# Patient Record
Sex: Male | Born: 1935
Health system: Southern US, Community
[De-identification: ages and names within clinical notes are randomized; demographics above are authoritative.]

## PROBLEM LIST (undated history)

## (undated) DIAGNOSIS — M1990 Unspecified osteoarthritis, unspecified site: Secondary | ICD-10-CM

## (undated) DIAGNOSIS — Z87442 Personal history of urinary calculi: Secondary | ICD-10-CM

## (undated) DIAGNOSIS — E785 Hyperlipidemia, unspecified: Secondary | ICD-10-CM

## (undated) DIAGNOSIS — L309 Dermatitis, unspecified: Secondary | ICD-10-CM

## (undated) DIAGNOSIS — I4891 Unspecified atrial fibrillation: Secondary | ICD-10-CM

## (undated) DIAGNOSIS — G629 Polyneuropathy, unspecified: Secondary | ICD-10-CM

## (undated) DIAGNOSIS — E119 Type 2 diabetes mellitus without complications: Secondary | ICD-10-CM

## (undated) DIAGNOSIS — E669 Obesity, unspecified: Secondary | ICD-10-CM

## (undated) DIAGNOSIS — R911 Solitary pulmonary nodule: Secondary | ICD-10-CM

## (undated) HISTORY — DX: Polyneuropathy, unspecified: G62.9

## (undated) HISTORY — DX: Hyperlipidemia, unspecified: E78.5

## (undated) HISTORY — DX: Solitary pulmonary nodule: R91.1

## (undated) HISTORY — PX: INCISION AND DRAINAGE: SHX5863

## (undated) HISTORY — DX: Obesity, unspecified: E66.9

## (undated) HISTORY — DX: Dermatitis, unspecified: L30.9

---

## 2004-06-10 ENCOUNTER — Encounter: Admission: RE | Admit: 2004-06-10 | Discharge: 2004-06-10 | Payer: Self-pay | Admitting: Internal Medicine

## 2005-09-24 ENCOUNTER — Ambulatory Visit (HOSPITAL_COMMUNITY): Admission: RE | Admit: 2005-09-24 | Discharge: 2005-09-24 | Payer: Self-pay | Admitting: Thoracic Surgery

## 2007-08-23 ENCOUNTER — Ambulatory Visit: Payer: Self-pay | Admitting: Gastroenterology

## 2008-10-17 ENCOUNTER — Telehealth (INDEPENDENT_AMBULATORY_CARE_PROVIDER_SITE_OTHER): Payer: Self-pay | Admitting: *Deleted

## 2008-10-21 ENCOUNTER — Ambulatory Visit: Payer: Self-pay

## 2008-10-21 ENCOUNTER — Encounter: Payer: Self-pay | Admitting: Internal Medicine

## 2009-02-26 ENCOUNTER — Ambulatory Visit (HOSPITAL_COMMUNITY): Admission: RE | Admit: 2009-02-26 | Discharge: 2009-02-28 | Payer: Self-pay | Admitting: Orthopaedic Surgery

## 2010-09-25 LAB — DIFFERENTIAL
Basophils Absolute: 0.1 10*3/uL (ref 0.0–0.1)
Basophils Relative: 1 % (ref 0–1)
Eosinophils Absolute: 0.1 10*3/uL (ref 0.0–0.7)
Eosinophils Relative: 1 % (ref 0–5)
Lymphocytes Relative: 18 % (ref 12–46)
Lymphs Abs: 2.3 10*3/uL (ref 0.7–4.0)
Monocytes Absolute: 1 10*3/uL (ref 0.1–1.0)
Monocytes Relative: 8 % (ref 3–12)
Neutro Abs: 9 10*3/uL — ABNORMAL HIGH (ref 1.7–7.7)
Neutrophils Relative %: 72 % (ref 43–77)

## 2010-09-25 LAB — GLUCOSE, CAPILLARY
Glucose-Capillary: 104 mg/dL — ABNORMAL HIGH (ref 70–99)
Glucose-Capillary: 108 mg/dL — ABNORMAL HIGH (ref 70–99)
Glucose-Capillary: 109 mg/dL — ABNORMAL HIGH (ref 70–99)
Glucose-Capillary: 115 mg/dL — ABNORMAL HIGH (ref 70–99)
Glucose-Capillary: 119 mg/dL — ABNORMAL HIGH (ref 70–99)
Glucose-Capillary: 121 mg/dL — ABNORMAL HIGH (ref 70–99)
Glucose-Capillary: 127 mg/dL — ABNORMAL HIGH (ref 70–99)
Glucose-Capillary: 93 mg/dL (ref 70–99)

## 2010-09-25 LAB — BASIC METABOLIC PANEL
BUN: 18 mg/dL (ref 6–23)
BUN: 20 mg/dL (ref 6–23)
CO2: 22 mEq/L (ref 19–32)
CO2: 24 mEq/L (ref 19–32)
Calcium: 8.8 mg/dL (ref 8.4–10.5)
Calcium: 9.5 mg/dL (ref 8.4–10.5)
Chloride: 102 mEq/L (ref 96–112)
Chloride: 99 mEq/L (ref 96–112)
Creatinine, Ser: 1.5 mg/dL (ref 0.4–1.5)
Creatinine, Ser: 1.7 mg/dL — ABNORMAL HIGH (ref 0.4–1.5)
GFR calc Af Amer: 47 mL/min — ABNORMAL LOW (ref >60)
GFR calc Af Amer: 55 mL/min — ABNORMAL LOW (ref >60)
GFR calc non Af Amer: 39 mL/min — ABNORMAL LOW (ref >60)
GFR calc non Af Amer: 45 mL/min — ABNORMAL LOW (ref >60)
Glucose, Bld: 128 mg/dL — ABNORMAL HIGH (ref 70–99)
Glucose, Bld: 151 mg/dL — ABNORMAL HIGH (ref 70–99)
Potassium: 4.5 mEq/L (ref 3.5–5.1)
Potassium: 5.3 mEq/L — ABNORMAL HIGH (ref 3.5–5.1)
Sodium: 134 mEq/L — ABNORMAL LOW (ref 135–145)
Sodium: 136 mEq/L (ref 135–145)

## 2010-09-25 LAB — CULTURE, ROUTINE-ABSCESS

## 2010-09-25 LAB — ANAEROBIC CULTURE

## 2010-09-25 LAB — CULTURE, BLOOD (ROUTINE X 2)
Culture: NO GROWTH
Culture: NO GROWTH

## 2010-09-25 LAB — CBC
HCT: 36.2 % — ABNORMAL LOW (ref 39.0–52.0)
HCT: 43 % (ref 39.0–52.0)
Hemoglobin: 12.6 g/dL — ABNORMAL LOW (ref 13.0–17.0)
Hemoglobin: 14.8 g/dL (ref 13.0–17.0)
MCHC: 34.4 g/dL (ref 30.0–36.0)
MCHC: 34.8 g/dL (ref 30.0–36.0)
MCV: 96.8 fL (ref 78.0–100.0)
MCV: 97.4 fL (ref 78.0–100.0)
Platelets: 263 10*3/uL (ref 150–400)
Platelets: 332 10*3/uL (ref 150–400)
RBC: 3.71 MIL/uL — ABNORMAL LOW (ref 4.22–5.81)
RBC: 4.44 MIL/uL (ref 4.22–5.81)
RDW: 12.3 % (ref 11.5–15.5)
RDW: 13 % (ref 11.5–15.5)
WBC: 12.5 10*3/uL — ABNORMAL HIGH (ref 4.0–10.5)
WBC: 8.1 10*3/uL (ref 4.0–10.5)

## 2011-04-20 ENCOUNTER — Ambulatory Visit (INDEPENDENT_AMBULATORY_CARE_PROVIDER_SITE_OTHER): Payer: Medicare Other | Admitting: Internal Medicine

## 2011-04-20 ENCOUNTER — Encounter: Payer: Self-pay | Admitting: Internal Medicine

## 2011-04-20 VITALS — BP 140/72 | HR 76 | Ht 76.0 in | Wt 229.0 lb

## 2011-04-20 DIAGNOSIS — R1013 Epigastric pain: Secondary | ICD-10-CM

## 2011-04-20 NOTE — Patient Instructions (Signed)
Please follow up as needed 

## 2011-04-20 NOTE — Progress Notes (Signed)
HISTORY OF PRESENT ILLNESS:  Albert [name] is a 77 y.o. male , generally healthy, who sent today by Dr. Clelia Croft regarding epigastric pain. Patient was seen by Dr. Clelia Croft on 04/12/2011 for 1 month history of epigastric pain radiating into the back. Patient had been using NSAIDs and alcohol regularly. He did report some reflux symptoms and that antacids transiently help the discomfort. He underwent blood work including CBC, comprehensive metabolic panel, amylase, and lipase. These were all unremarkable. He was instructed to discontinue NSAIDs and limit alcohol. As well he was placed on daily PPI which he continues to take. He is pleased to report that several days after initiating these therapies, his pain resolved without recurrence. He states he feels well. He has had significant intentional weight loss over the past 5 years, no change in weight over the past few months. His GI review of systems is otherwise remarkable for minor constipation. He tells me that he underwent colonoscopy previously with our group. Those records have not been discovered yet.  REVIEW OF SYSTEMS:  All non-GI ROS negative except for joint aches  Past Medical History  Diagnosis Date  . Gout   . Hyperlipemia   . Pulmonary nodule     No past surgical history on file.  Social History Albert [name]  reports that he has never smoked. He has never used smokeless tobacco. He reports that he drinks alcohol. He reports that he does not use illicit drugs.  family history is negative for Colon cancer.  No Known Allergies     PHYSICAL EXAMINATION: Vital signs: BP 140/72  Pulse 76  Ht 6\' 4"  (1.93 m)  Wt 229 lb (103.874 kg)  BMI 27.87 kg/m2  Constitutional: generally well-appearing, no acute distress Psychiatric: alert and oriented x3, cooperative Eyes: extraocular movements intact, anicteric, conjunctiva pink Mouth: oral pharynx moist, no lesions Neck: supple no lymphadenopathy Cardiovascular: heart regular rate and  rhythm, no murmur Lungs: clear to auscultation bilaterally Abdomen: soft, nontender, nondistended, no obvious ascites, no peritoneal signs, normal bowel sounds, no organomegaly Extremities: no lower extremity edema bilaterally Skin: no lesions on visible extremities Neuro: No focal deficits.      ASSESSMENT:  #1. Previous problems with epigastric discomfort as described. Possibly peptic ulcer disease or gastritis related to NSAIDs. Problem improved off NSAIDs and on PPI  PLAN:  #1. Complete 8 weeks course of PPI #2. Continue to avoid NSAIDs and alcohol #3. If symptoms recur, plan for upper endoscopy. Patient understands this plan. He has been given my professional card to contact the office for problems. Otherwise, he will return to the care of Dr. Clelia Croft

## 2011-10-18 ENCOUNTER — Encounter: Payer: Self-pay | Admitting: Internal Medicine

## 2011-10-18 ENCOUNTER — Ambulatory Visit (INDEPENDENT_AMBULATORY_CARE_PROVIDER_SITE_OTHER): Payer: Medicare Other | Admitting: Internal Medicine

## 2011-10-18 VITALS — BP 112/72 | HR 63 | Ht 76.0 in | Wt 222.8 lb

## 2011-10-18 DIAGNOSIS — R195 Other fecal abnormalities: Secondary | ICD-10-CM

## 2011-10-18 NOTE — Progress Notes (Signed)
HISTORY OF PRESENT ILLNESS:  Albert [name] is a 78 y.o. male with the below listed medical history is sent today regarding Hemoccult-positive stool on routine testing. The patient was evaluated by myself, for the first time, 04/20/2011 regarding epigastric pain. See that dictation. After refraining from NSAID and alcohol use, as well as starting the PPI, his symptoms resolved. He has had no recurrence. He is currently using NSAIDs. The PPI. He underwent routine annual evaluation including Hemoccult testing which returned positive. CBC around that time, 09/09/2011, was normal with a hemoglobin of 13.5 and normal MCV. Patient reports entirely negative GI review of systems. No documentation of endoscopic evaluations previously  REVIEW OF SYSTEMS:  All non-GI ROS negative except for Joint aches  Past Medical History  Diagnosis Date  . Gout   . Hyperlipemia   . Pulmonary nodule   . Diabetes mellitus   . Obesity   . Dermatitis   . Peripheral neuropathy     Past Surgical History  Procedure Date  . Finger surgery     Social History Albert [name]  reports that he has never smoked. He has never used smokeless tobacco. He reports that he drinks alcohol. He reports that he does not use illicit drugs.  family history is negative for Colon cancer.  No Known Allergies     PHYSICAL EXAMINATION: Vital signs: BP 112/72  Pulse 63  Wt 222 lb 12.8 oz (101.061 kg)  SpO2 97% General: Well-developed, well-nourished, no acute distress HEENT: Sclerae are anicteric, conjunctiva pink. Oral mucosa intact Lungs: Clear Heart: Regular Abdomen: soft, nontender, nondistended, no obvious ascites, no peritoneal signs, normal bowel sounds. No organomegaly. Extremities: No edema Psychiatric: alert and oriented x3. Cooperative      ASSESSMENT:  #1. Asymptomatic Hemoccult-positive stool. Rule out occult GI mucosal lesion. Possibly NSAID-induced lesion. Rule out neoplasia. #2. Normal hemoglobin #3.  General medical problems   PLAN:  #1. Recommended colonoscopy and upper endoscopy. We discussed the possible causes for Hemoccult positive stool including insignificant causes of also important causes such as neoplasia (including cancer) and NSAID-induced ulcers. I discussed with him colonoscopy and upper endoscopy in detail. He understands. However, at this point, he declines having the procedure were performed. I recommend that he take a PPI prophylactically as long as he is on NSAIDs. He stated that he may contact the office to set up procedures at some point in the future. I encouraged him to do so. Otherwise, he will return to the care of Dr. Clelia Croft.

## 2011-10-18 NOTE — Patient Instructions (Signed)
Please follow up when you are ready to discuss possible procedures with Dr. Marina Goodell

## 2013-02-28 ENCOUNTER — Other Ambulatory Visit (HOSPITAL_COMMUNITY): Payer: Medicare Other

## 2013-03-12 ENCOUNTER — Ambulatory Visit (HOSPITAL_COMMUNITY): Payer: Medicare Other | Attending: Internal Medicine | Admitting: Radiology

## 2013-03-12 ENCOUNTER — Other Ambulatory Visit (HOSPITAL_COMMUNITY): Payer: Self-pay | Admitting: Internal Medicine

## 2013-03-12 DIAGNOSIS — I080 Rheumatic disorders of both mitral and aortic valves: Secondary | ICD-10-CM | POA: Insufficient documentation

## 2013-03-12 DIAGNOSIS — E785 Hyperlipidemia, unspecified: Secondary | ICD-10-CM | POA: Insufficient documentation

## 2013-03-12 DIAGNOSIS — E119 Type 2 diabetes mellitus without complications: Secondary | ICD-10-CM | POA: Insufficient documentation

## 2013-03-12 DIAGNOSIS — R011 Cardiac murmur, unspecified: Secondary | ICD-10-CM

## 2013-03-12 NOTE — Progress Notes (Signed)
Echocardiogram performed.  

## 2017-04-25 ENCOUNTER — Telehealth (HOSPITAL_COMMUNITY): Payer: Self-pay | Admitting: Internal Medicine

## 2017-04-28 NOTE — Telephone Encounter (Signed)
User: Trina AoGRIFFIN, Albert [name] Date/time: 04/28/17 10:37 AM  Comment: Called pt and was unable to leave [name] message...it asked me to enter [name] "remote access code"  Context:  Outcome: No Answer/Busy  Phone number: [phone] Phone Type: Home Phone  Comm. type: Telephone Call type: Outgoing  Contact: Oralia Manishomas, Micha S Relation to patient: Self    User: Trina AoGRIFFIN, Albert [name] Date/time: 04/27/17 9:59 AM  Comment: Called pt and was unable to leave [name] message due to the message stating "please leave your remote access code" .   Context:  Outcome: Not Available  Phone number: [phone] Phone Type: Home Phone  Comm. type: Telephone Call type: Outgoing  Contact: Oralia Manishomas, [name] Relation to patient: Self    User: Trina AoGRIFFIN, Albert [name] Date/time: 04/25/17 1:56 PM  Comment: Called pt and was unable to leave message...message said to "please leave [name] remote access number"..  Context:  Outcome: Not Available  Phone number: [phone] Phone Type: Home Phone  Comm. type: Telephone Call type: Outgoing  Contact: Oralia Manishomas, [name] Relation to patient: Self   Called [facility] and left [name] message on Misty's voicemail informing her that the patient had been contacted [name] few times to be scheduled.

## 2017-11-30 ENCOUNTER — Encounter (HOSPITAL_COMMUNITY): Payer: Self-pay | Admitting: Emergency Medicine

## 2017-11-30 ENCOUNTER — Other Ambulatory Visit: Payer: Self-pay

## 2017-11-30 ENCOUNTER — Emergency Department (HOSPITAL_COMMUNITY): Payer: Medicare HMO

## 2017-11-30 ENCOUNTER — Inpatient Hospital Stay (HOSPITAL_COMMUNITY)
Admission: EM | Admit: 2017-11-30 | Discharge: 2017-12-03 | DRG: 308 | Disposition: A | Discharge status: Home / Self Care | Payer: Medicare HMO | Attending: Internal Medicine | Admitting: Internal Medicine

## 2017-11-30 DIAGNOSIS — I272 Pulmonary hypertension, unspecified: Secondary | ICD-10-CM | POA: Diagnosis present

## 2017-11-30 DIAGNOSIS — R748 Abnormal levels of other serum enzymes: Secondary | ICD-10-CM | POA: Diagnosis not present

## 2017-11-30 DIAGNOSIS — Z974 Presence of external hearing-aid: Secondary | ICD-10-CM

## 2017-11-30 DIAGNOSIS — F0390 Unspecified dementia without behavioral disturbance: Secondary | ICD-10-CM | POA: Diagnosis present

## 2017-11-30 DIAGNOSIS — E1122 Type 2 diabetes mellitus with diabetic chronic kidney disease: Secondary | ICD-10-CM | POA: Diagnosis not present

## 2017-11-30 DIAGNOSIS — W19XXXA Unspecified fall, initial encounter: Secondary | ICD-10-CM | POA: Diagnosis not present

## 2017-11-30 DIAGNOSIS — I350 Nonrheumatic aortic (valve) stenosis: Secondary | ICD-10-CM | POA: Diagnosis present

## 2017-11-30 DIAGNOSIS — R0609 Other forms of dyspnea: Secondary | ICD-10-CM | POA: Diagnosis not present

## 2017-11-30 DIAGNOSIS — Z8673 Personal history of transient ischemic attack (TIA), and cerebral infarction without residual deficits: Secondary | ICD-10-CM

## 2017-11-30 DIAGNOSIS — I130 Hypertensive heart and chronic kidney disease with heart failure and stage 1 through stage 4 chronic kidney disease, or unspecified chronic kidney disease: Secondary | ICD-10-CM | POA: Diagnosis not present

## 2017-11-30 DIAGNOSIS — E1129 Type 2 diabetes mellitus with other diabetic kidney complication: Secondary | ICD-10-CM | POA: Diagnosis present

## 2017-11-30 DIAGNOSIS — I4891 Unspecified atrial fibrillation: Secondary | ICD-10-CM

## 2017-11-30 DIAGNOSIS — E785 Hyperlipidemia, unspecified: Secondary | ICD-10-CM | POA: Diagnosis not present

## 2017-11-30 DIAGNOSIS — F0391 Unspecified dementia with behavioral disturbance: Secondary | ICD-10-CM | POA: Diagnosis not present

## 2017-11-30 DIAGNOSIS — R778 Other specified abnormalities of plasma proteins: Secondary | ICD-10-CM | POA: Diagnosis present

## 2017-11-30 DIAGNOSIS — R4781 Slurred speech: Secondary | ICD-10-CM | POA: Diagnosis not present

## 2017-11-30 DIAGNOSIS — N183 Chronic kidney disease, stage 3 unspecified: Secondary | ICD-10-CM | POA: Diagnosis present

## 2017-11-30 DIAGNOSIS — G9341 Metabolic encephalopathy: Secondary | ICD-10-CM | POA: Diagnosis not present

## 2017-11-30 DIAGNOSIS — I5031 Acute diastolic (congestive) heart failure: Secondary | ICD-10-CM | POA: Diagnosis not present

## 2017-11-30 DIAGNOSIS — E1121 Type 2 diabetes mellitus with diabetic nephropathy: Secondary | ICD-10-CM | POA: Diagnosis not present

## 2017-11-30 DIAGNOSIS — H9190 Unspecified hearing loss, unspecified ear: Secondary | ICD-10-CM | POA: Diagnosis present

## 2017-11-30 DIAGNOSIS — W01198A Fall on same level from slipping, tripping and stumbling with subsequent striking against other object, initial encounter: Secondary | ICD-10-CM | POA: Diagnosis not present

## 2017-11-30 DIAGNOSIS — N179 Acute kidney failure, unspecified: Secondary | ICD-10-CM | POA: Diagnosis not present

## 2017-11-30 DIAGNOSIS — R079 Chest pain, unspecified: Secondary | ICD-10-CM | POA: Diagnosis present

## 2017-11-30 DIAGNOSIS — S0003XA Contusion of scalp, initial encounter: Secondary | ICD-10-CM | POA: Diagnosis not present

## 2017-11-30 DIAGNOSIS — I499 Cardiac arrhythmia, unspecified: Secondary | ICD-10-CM | POA: Diagnosis not present

## 2017-11-30 DIAGNOSIS — Z6831 Body mass index (BMI) 31.0-31.9, adult: Secondary | ICD-10-CM | POA: Diagnosis not present

## 2017-11-30 DIAGNOSIS — R4182 Altered mental status, unspecified: Secondary | ICD-10-CM | POA: Diagnosis not present

## 2017-11-30 DIAGNOSIS — R5383 Other fatigue: Secondary | ICD-10-CM | POA: Diagnosis not present

## 2017-11-30 DIAGNOSIS — R7989 Other specified abnormal findings of blood chemistry: Secondary | ICD-10-CM | POA: Diagnosis present

## 2017-11-30 HISTORY — DX: Type 2 diabetes mellitus without complications: E11.9

## 2017-11-30 HISTORY — DX: Unspecified atrial fibrillation: I48.91

## 2017-11-30 HISTORY — DX: Unspecified osteoarthritis, unspecified site: M19.90

## 2017-11-30 HISTORY — DX: Personal history of urinary calculi: Z87.442

## 2017-11-30 LAB — DIFFERENTIAL
ABS IMMATURE GRANULOCYTES: 0.1 10*3/uL (ref 0.0–0.1)
BASOS PCT: 1 %
Basophils Absolute: 0.1 10*3/uL (ref 0.0–0.1)
Eosinophils Absolute: 0.1 10*3/uL (ref 0.0–0.7)
Eosinophils Relative: 1 %
Immature Granulocytes: 1 %
LYMPHS ABS: 1.2 10*3/uL (ref 0.7–4.0)
Lymphocytes Relative: 13 %
MONO ABS: 0.9 10*3/uL (ref 0.1–1.0)
MONOS PCT: 10 %
NEUTROS ABS: 6.7 10*3/uL (ref 1.7–7.7)
Neutrophils Relative %: 74 %

## 2017-11-30 LAB — COMPREHENSIVE METABOLIC PANEL
ALK PHOS: 60 U/L (ref 38–126)
ALT: 22 U/L (ref 17–63)
AST: 26 U/L (ref 15–41)
Albumin: 3.6 g/dL (ref 3.5–5.0)
Anion gap: 9 (ref 5–15)
BILIRUBIN TOTAL: 0.9 mg/dL (ref 0.3–1.2)
BUN: 29 mg/dL — AB (ref 6–20)
CALCIUM: 9.4 mg/dL (ref 8.9–10.3)
CHLORIDE: 103 mmol/L (ref 101–111)
CO2: 26 mmol/L (ref 22–32)
Creatinine, Ser: 1.62 mg/dL — ABNORMAL HIGH (ref 0.61–1.24)
GFR calc Af Amer: 42 mL/min — ABNORMAL LOW (ref >60)
GFR, EST NON AFRICAN AMERICAN: 36 mL/min — AB (ref >60)
Glucose, Bld: 112 mg/dL — ABNORMAL HIGH (ref 65–99)
Potassium: 4.9 mmol/L (ref 3.5–5.1)
Sodium: 138 mmol/L (ref 135–145)
Total Protein: 6.7 g/dL (ref 6.5–8.1)

## 2017-11-30 LAB — PROTIME-INR
INR: 1.15
Prothrombin Time: 14.6 seconds (ref 11.4–15.2)

## 2017-11-30 LAB — CBC
HEMATOCRIT: 40.6 % (ref 39.0–52.0)
HEMOGLOBIN: 12.9 g/dL — AB (ref 13.0–17.0)
MCH: 31.5 pg (ref 26.0–34.0)
MCHC: 31.8 g/dL (ref 30.0–36.0)
MCV: 99.3 fL (ref 78.0–100.0)
Platelets: 251 10*3/uL (ref 150–400)
RBC: 4.09 MIL/uL — AB (ref 4.22–5.81)
RDW: 14.1 % (ref 11.5–15.5)
WBC: 9 10*3/uL (ref 4.0–10.5)

## 2017-11-30 LAB — I-STAT CHEM 8, ED
BUN: 31 mg/dL — AB (ref 6–20)
Calcium, Ion: 1.13 mmol/L — ABNORMAL LOW (ref 1.15–1.40)
Chloride: 101 mmol/L (ref 101–111)
Creatinine, Ser: 1.5 mg/dL — ABNORMAL HIGH (ref 0.61–1.24)
GLUCOSE: 108 mg/dL — AB (ref 65–99)
HEMATOCRIT: 40 % (ref 39.0–52.0)
Hemoglobin: 13.6 g/dL (ref 13.0–17.0)
Potassium: 4.8 mmol/L (ref 3.5–5.1)
Sodium: 137 mmol/L (ref 135–145)
TCO2: 23 mmol/L (ref 22–32)

## 2017-11-30 LAB — I-STAT TROPONIN, ED: TROPONIN I, POC: 0.15 ng/mL — AB (ref 0.00–0.08)

## 2017-11-30 LAB — APTT: aPTT: 33 seconds (ref 24–36)

## 2017-11-30 MED ORDER — ATORVASTATIN CALCIUM 80 MG PO TABS
80.0000 mg | ORAL_TABLET | Freq: Every day | ORAL | Status: DC
  Administered 2017-12-01 – 2017-12-02 (×3): 80 mg via ORAL
  Filled 2017-11-30: qty 4
  Filled 2017-11-30 (×2): qty 1

## 2017-11-30 MED ORDER — SODIUM CHLORIDE 0.9 % IV SOLN: INTRAVENOUS | Status: DC

## 2017-11-30 MED ORDER — ONDANSETRON HCL 4 MG/2ML IJ SOLN: 4.0000 mg | Freq: Four times a day (QID) | INTRAMUSCULAR | Status: DC | PRN

## 2017-11-30 MED ORDER — HEPARIN SODIUM (PORCINE) 5000 UNIT/ML IJ SOLN: 5000.0000 [IU] | Freq: Three times a day (TID) | INTRAMUSCULAR | Status: DC

## 2017-11-30 MED ORDER — ACETAMINOPHEN 325 MG PO TABS: 650.0000 mg | ORAL_TABLET | ORAL | Status: DC | PRN

## 2017-11-30 MED ORDER — ZOLPIDEM TARTRATE 5 MG PO TABS
5.0000 mg | ORAL_TABLET | Freq: Every evening | ORAL | Status: DC | PRN
  Administered 2017-12-03: 5 mg via ORAL
  Filled 2017-11-30: qty 1

## 2017-11-30 MED ORDER — MORPHINE SULFATE (PF) 4 MG/ML IV SOLN: 1.0000 mg | INTRAVENOUS | Status: DC | PRN

## 2017-11-30 MED ORDER — NITROGLYCERIN 0.4 MG SL SUBL: 0.4000 mg | SUBLINGUAL_TABLET | SUBLINGUAL | Status: DC | PRN

## 2017-11-30 MED ORDER — DEXTROSE 5 % IV SOLN
5.0000 mg/h | Freq: Once | INTRAVENOUS | Status: AC
  Administered 2017-11-30 – 2017-12-01 (×2): 7.5 mg/h via INTRAVENOUS

## 2017-11-30 MED ORDER — LORAZEPAM 2 MG/ML IJ SOLN
1.0000 mg | Freq: Once | INTRAMUSCULAR | Status: AC
Start: 2017-11-30 — End: 2017-11-30
  Administered 2017-11-30: 1 mg via INTRAVENOUS
  Filled 2017-11-30: qty 1

## 2017-11-30 MED ORDER — ASPIRIN EC 325 MG PO TBEC
325.0000 mg | DELAYED_RELEASE_TABLET | Freq: Every day | ORAL | Status: DC
  Administered 2017-12-01 – 2017-12-03 (×3): 325 mg via ORAL
  Filled 2017-11-30 (×3): qty 1

## 2017-11-30 MED ORDER — HYDRALAZINE HCL 20 MG/ML IJ SOLN: 5.0000 mg | INTRAMUSCULAR | Status: DC | PRN

## 2017-11-30 MED ORDER — DILTIAZEM HCL 100 MG IV SOLR
INTRAVENOUS | Status: AC
  Filled 2017-11-30: qty 100

## 2017-11-30 MED ORDER — SODIUM CHLORIDE 0.9 % IV BOLUS
500.0000 mL | Freq: Once | INTRAVENOUS | Status: AC
  Administered 2017-11-30: 500 mL via INTRAVENOUS

## 2017-11-30 MED ORDER — HEPARIN BOLUS VIA INFUSION
4000.0000 [IU] | Freq: Once | INTRAVENOUS | Status: AC
  Administered 2017-12-01: 4000 [IU] via INTRAVENOUS
  Filled 2017-11-30: qty 4000

## 2017-11-30 MED ORDER — HEPARIN (PORCINE) IN NACL 100-0.45 UNIT/ML-% IJ SOLN
1150.0000 [IU]/h | INTRAMUSCULAR | Status: DC
  Administered 2017-12-01 (×2): 1150 [IU]/h via INTRAVENOUS
  Filled 2017-11-30 (×2): qty 250

## 2017-11-30 MED ORDER — DEXTROSE 5 % IV SOLN
5.0000 mg/h | Freq: Once | INTRAVENOUS | Status: AC
  Administered 2017-11-30: 5 mg/h via INTRAVENOUS
  Filled 2017-11-30: qty 100

## 2017-11-30 NOTE — ED Provider Notes (Signed)
Medical screening examination/treatment/procedure(s) were conducted as a shared visit with non-physician practitioner(s) and myself.  I personally evaluated the patient during the encounter.  EKG Interpretation  Date/Time:  Wednesday November 30 2017 18:33:39 EDT Ventricular Rate:  126 PR Interval:    QRS Duration: 87 QT Interval:  322 QTC Calculation: 467 R Axis:   29 Text Interpretation:  Atrial fibrillation ST depression, probably rate related Confirmed by Lorre NickAllen, Ricky Doan ([id number]) on 11/30/2017 8:[id number] PM 84 year old male here after mechanical fall prior to arrival.  Was found to have elevated troponin as well as EKG that showed A. fib with RVR which I suspect is the culprit of his elevated troponin.  Head CT does show a small left caudate head lacunar infarct.  Will consult neurology.  Denies any chest pain.  Does have evidence of renal insufficiency.  Blood pressure is slightly soft and will be treated with IV hydration.  Heart rate is up into the 130s.  Will be given low-dose Cardizem and be admitted to the hospitalist service.   Lorre NickAllen, Ilissa Rosner, MD 11/30/17 2011

## 2017-11-30 NOTE — ED Notes (Signed)
Informed triage RN Marcie BalKaitlynn of I-stat troponin 0.15

## 2017-11-30 NOTE — ED Triage Notes (Addendum)
Patient to ED with wife, who states PCP suggested patient come here for stroke evaluation. Patient has had slurred speech, increased confusion, and loss of balance x 2-3 weeks. Patient normally ambulates with a walker, but was walking to the restroom without cane or walker, lost balance, and reached for the doorknob - fell and hit R side of head on door. No LOC. Hematoma/knot to head, pt reports mild headache. A&O x 4 at this time, PERRL. Patient has irregular heart rhythm in triage, no known hx of A-Fib. Patient denies CP, dizziness or vision changes, no extremity weakness, though he does report generalized weakness ans shortness of breath.

## 2017-11-30 NOTE — H&P (Addendum)
History and Physical    Albert [name] ZOX:[id number] DOB: Jan 03, 1928 DOA: 11/30/2017  Referring MD/NP/PA:   PCP: Martha Clan, MD   Patient coming from:  The patient is coming from home.  At baseline, pt is independent for most of ADL.   Chief Complaint: Confusion, poor balance, chest pain, palpitation, fall  HPI: Albert [name] is a 84 y.o. male with medical history significant of diet-controlled diabetes, hyperlipidemia, gout, obesity, CKD-3, currently not taking medications except for Aleve, who presents with confusion, poor balance, chest pain, palpitation and fall.  Per patient's son and wife, patient has been confused in the past 2 weeks.  Patient had broken tooth removed 2 weeks ago, since then he has been mildly confused.  She also has poor balance. He fell this AM, injured his right side of head on the door.  No LOC.  Patient does not have unilateral weakness or numbness in extremities.  No facial droop.  He seems to have mild slurred speech per his son.  He has chronic hearing loss, wearing hearing aid.  No vision loss.  Patient also has been having intermittent mild chest pain for about 1 week.  It is located in substernal area, dull, nonradiating.  It is exertional, happens when he moves around intermittently.  Currently patient does not have chest pain or shortness of breath, cough.  No fever or chills.  Patient does not have nausea, vomiting, diarrhea, abdominal pain, symptoms of UTI.  Patient was found to have new onset A. fib with RVR, with heart rate up to 130s, Cardizem drip is started in ED.  ED Course: pt was found to have positive troponin 0.15, WBC 9.0, INR 1.15, PTT 33, renal function close to baseline, temperature 97.2, tachycardia, no tachypnea, oxygen saturation 95% on room air.  Patient is admitted to stepdown as inpatient.  Cardiology will be consulted by EDP.  # CT of head showed: 1. Right parietal scalp contusion without underlying skull fracture. 2. Moderate  superficial and central atrophy with small vessel ischemia. Small left caudate head lacunar infarct. 3. Dense atherosclerotic calcifications at the skull base as above. 4. No acute intracranial abnormality identified on CT.  Review of Systems: Could not be reviewed accurately due to altered mental status.  Allergy: No Known Allergies  Past Medical History:  Diagnosis Date  . Dermatitis   . Diabetes mellitus   . Gout   . Hyperlipemia   . Obesity   . Peripheral neuropathy   . Pulmonary nodule     Past Surgical History:  Procedure Laterality Date  . FINGER SURGERY      Social History:  reports that he has never smoked. He has never used smokeless tobacco. He reports that he drinks alcohol. He reports that he does not use drugs.  Family History:  Family History  Problem Relation Age of Onset  . Colon cancer Neg Hx      Prior to Admission medications   Not on File    Physical Exam: Vitals:   12/01/17 0145 12/01/17 0200 12/01/17 0215 12/01/17 0230  BP:  110/78  103/83  Pulse:  96    Resp: 20 18 (!) 32   Temp:      TempSrc:      SpO2:  95% 95%   Weight:      Height:       General: Not in acute distress HEENT: has a hematoma in the right parietal area       Eyes: PERRL, EOMI,  no scleral icterus.       ENT: No discharge from the ears and nose, no pharynx injection, no tonsillar enlargement.        Neck: No JVD, no bruit, no mass felt. Heme: No neck lymph node enlargement. Cardiac: S1/S2, RRR, No murmurs, No gallops or rubs. Respiratory: No rales, wheezing, rhonchi or rubs. GI: Soft, nondistended, nontender, no rebound pain, no organomegaly, BS present. GU: No hematuria Ext: No pitting leg edema bilaterally. 2+DP/PT pulse bilaterally. Musculoskeletal: No joint deformities, No joint redness or warmth, no limitation of ROM in spin. Skin: No rashes.  Neuro: mildly confused, oriented to place and person, but not to time, cranial nerves II-XII grossly intact, moves all  extremities normally. Psych: Patient is not psychotic, no suicidal or hemocidal ideation.  Labs on Admission: I have personally reviewed following labs and imaging studies  CBC: Recent Labs  Lab 11/30/17 1745 11/30/17 1751  WBC 9.0  --   NEUTROABS 6.7  --   HGB 12.9* 13.6  HCT 40.6 40.0  MCV 99.3  --   PLT 251  --    Basic Metabolic Panel: Recent Labs  Lab 11/30/17 1745 11/30/17 1751  NA 138 137  K 4.9 4.8  CL 103 101  CO2 26  --   GLUCOSE 112* 108*  BUN 29* 31*  CREATININE 1.62* 1.50*  CALCIUM 9.4  --    GFR: Estimated Creatinine Clearance: 41.2 mL/min (A) (by C-G formula based on SCr of 1.5 mg/dL (H)). Liver Function Tests: Recent Labs  Lab 11/30/17 1745  AST 26  ALT 22  ALKPHOS 60  BILITOT 0.9  PROT 6.7  ALBUMIN 3.6   No results for input(s): LIPASE, AMYLASE in the last 168 hours. Recent Labs  Lab 11/30/17 2335  AMMONIA 34   Coagulation Profile: Recent Labs  Lab 11/30/17 1745  INR 1.15   Cardiac Enzymes: Recent Labs  Lab 11/30/17 2335  TROPONINI 0.14*   BNP (last 3 results) No results for input(s): PROBNP in the last 8760 hours. HbA1C: No results for input(s): HGBA1C in the last 72 hours. CBG: No results for input(s): GLUCAP in the last 168 hours. Lipid Profile: No results for input(s): CHOL, HDL, LDLCALC, TRIG, CHOLHDL, LDLDIRECT in the last 72 hours. Thyroid Function Tests: No results for input(s): TSH, T4TOTAL, FREET4, T3FREE, THYROIDAB in the last 72 hours. Anemia Panel: No results for input(s): VITAMINB12, FOLATE, FERRITIN, TIBC, IRON, RETICCTPCT in the last 72 hours. Urine analysis: No results found for: COLORURINE, APPEARANCEUR, LABSPEC, PHURINE, GLUCOSEU, HGBUR, BILIRUBINUR, KETONESUR, PROTEINUR, UROBILINOGEN, NITRITE, LEUKOCYTESUR Sepsis Labs: @LABRCNTIP (procalcitonin:4,lacticidven:4) )No results found for this or any previous visit (from the past 240 hour(s)).   Radiological Exams on Admission: Ct Head Wo Contrast  Result  Date: 11/30/2017 CLINICAL DATA:  Slurred speech and increased confusion with loss of balance for 2-3 weeks. EXAM: CT HEAD WITHOUT CONTRAST TECHNIQUE: Contiguous axial images were obtained from the base of the skull through the vertex without intravenous contrast. COMPARISON:  None. FINDINGS: BRAIN: There is moderate sulcal and ventricular prominence consistent with superficial and central atrophy. Left caudate head lacunar infarct is noted, age indeterminate but likely chronic. No intraparenchymal hemorrhage, mass effect nor midline shift. Mild-to-moderate periventricular and subcortical white matter hypodensities consistent with chronic small vessel ischemic disease are identified. No acute large vascular territory infarcts. No abnormal extra-axial fluid collections. Basal cisterns are not effaced and midline. VASCULAR: Moderate calcific atherosclerosis of the carotid siphons and both middle cerebral arteries. SKULL: No skull fracture. No significant  scalp soft tissue swelling. SINUSES/ORBITS: The mastoid air-cells are clear. The included paranasal sinuses are well-aerated.The included ocular globes and orbital contents are non-suspicious. OTHER: Right posterior parietal scalp contusion is noted. IMPRESSION: 1. Right parietal scalp contusion without underlying skull fracture. 2. Moderate superficial and central atrophy with small vessel ischemia. Small left caudate head lacunar infarct. 3. Dense atherosclerotic calcifications at the skull base as above. 4. No acute intracranial abnormality identified on CT. Electronically Signed   By: Tollie Eth M.D.   On: 11/30/2017 18:26   Mr Brain Wo Contrast (neuro Protocol)  Result Date: 11/30/2017 CLINICAL DATA:  Slurred speech and altered mental status EXAM: MRI HEAD WITHOUT CONTRAST TECHNIQUE: Multiplanar, multiecho pulse sequences of the brain and surrounding structures were obtained without intravenous contrast. COMPARISON:  Head CT 11/30/2017 FINDINGS: The study is  degraded by motion, despite efforts to reduce this artifact, including utilization of motion-resistant MR sequences. The findings of the study are interpreted in the context of reduced sensitivity/specificity. BRAIN: There is no acute infarct, acute hemorrhage or mass effect. The midline structures are normal. There is an old right frontal lobe infarct. Minimal white matter hyperintensity, nonspecific and commonly seen in asymptomatic patients of this age. Generalized atrophy without lobar predilection. Susceptibility-sensitive sequences show no chronic microhemorrhage or superficial siderosis. VASCULAR: Major intracranial arterial and venous sinus flow voids are preserved. SKULL AND UPPER CERVICAL SPINE: The visualized skull base, calvarium, upper cervical spine and extracranial soft tissues are normal. SINUSES/ORBITS: No fluid levels or advanced mucosal thickening. No mastoid or middle ear effusion. The orbits are normal. IMPRESSION: Old right frontal lobe infarct and generalized brain atrophy without acute abnormality. Electronically Signed   By: Deatra Robinson M.D.   On: 11/30/2017 22:48     EKG: Independently reviewed.  Atrial fibrillation, QTc 409, ST depression in inferior leads and V3-V6, poor R wave progression  Assessment/Plan Principal Problem:   Acute metabolic encephalopathy Active Problems:   Atrial fibrillation with RVR (HCC)   Elevated troponin   Fall   Type II diabetes mellitus with renal manifestations (HCC)   HLD (hyperlipidemia)   CKD (chronic kidney disease), stage III (HCC)   Chest pain   Acute metabolic encephalopathy and fall: Etiology is not clear. Patient may have mild slurred speech and poor balance, but no other focal neurological findings. CT head is negative for acute intracranial abnormalities, but showed a small lacunar infarcts. MRI of brain is negative for acute issues, but showed old right frontal lobe infarct. DD include delirium, early stage of dementia. Will  need to r/u UA and vitamin B12 deficiency.   -will admit to SDU as inpt due to A fib with RVR -Frequent neuro check -Check B 12, TSH, RPR, ammonia level -Check orthostatic vital sign -PT/OT -IV fluids: Normal saline 500 mL  New onset of atrial fibrillation with RVR (HCC): HR is upto 130s. card was consulted by EDP -on Cardizem gtt -will start oral cardizem 30 mg tid -started IV heparin  -check TSH, BNP  Chest pain and elevated trop: trop 0.15, indicating possible NSTEMI - on IV heparin.  - cycle CE q6 x3 and repeat EKG in the am  - prn Nitroglycerin, Morphine, and aspirin, lipitor  - Risk factor stratification: will check FLP and A1C  - 2d echo  Type II diabetes mellitus with renal manifestations (HCC): no a1c on record. Not taking meds at home. Blood sugar is 108. -f/u A1c -check CBG qAM.  HLD: -started lipitor  CKD (chronic kidney disease), stage  III [facility]): Close to baseline. Baseline creatinine 1.5-1.7. His creatinine is 1.60, BUN 31. -Follow-up renal function by BMP  HLD: -on lipitor    DVT ppx: on IV Heparin Code Status: Partial code (I discussed with the patient wife and son, his son is POA, and explained the meaning of CODE STATUS, patient wants to be partial code, OK for CPR, but no intubation). Family Communication:   Yes, patient's wife and son  at bed side Disposition Plan:  Anticipate discharge back to previous home environment Consults called: Card Admission status:   SDU/inpation       Date of Service 12/01/2017    Lorretta Harp [facility] Pager [phone]  If 7PM-7AM, please contact night-coverage [email or web address] Password [email or web address] 12/01/2017, 3:22 AM

## 2017-11-30 NOTE — ED Provider Notes (Signed)
[facility] EMERGENCY DEPARTMENT Provider Note   CSN: [id number] Arrival date & time: 11/30/17  1724     History   Chief Complaint Chief Complaint  Patient presents with  . Stroke Symptoms    HPI Albert [name] is a 84 y.o. male.  HPI Patient presents to the emergency department with 2-week history of increasing fatigue with mild exertion and elevated heart rates.  Along with altered mental status.  The patient had a fall earlier this morning striking his head against the door frame.  The patient is unable to give me a totally accurate history.  The family mostly is a most history for him.  The patient denies  shortness of breath, headache,blurred vision, neck pain, fever, cough, numbness, dizziness, anorexia, edema, abdominal pain, nausea, vomiting, diarrhea, rash, back pain, dysuria, hematemesis, bloody stool, near syncope, or syncope. Past Medical History:  Diagnosis Date  . Dermatitis   . Diabetes mellitus   . Gout   . Hyperlipemia   . Obesity   . Peripheral neuropathy   . Pulmonary nodule     Patient Active Problem List   Diagnosis Date Noted  . Acute metabolic encephalopathy 11/30/2017  . Atrial fibrillation with RVR (HCC) 11/30/2017  . Elevated troponin 11/30/2017  . Fall 11/30/2017  . Type II diabetes mellitus with renal manifestations (HCC) 11/30/2017  . HLD (hyperlipidemia) 11/30/2017  . CKD (chronic kidney disease), stage III (HCC) 11/30/2017  . Chest pain 11/30/2017    Past Surgical History:  Procedure Laterality Date  . FINGER SURGERY          Home Medications    Prior to Admission medications   Not on File    Family History Family History  Problem Relation Age of Onset  . Colon cancer Neg Hx     Social History Social History   Tobacco Use  . Smoking status: Never Smoker  . Smokeless tobacco: Never Used  Substance Use Topics  . Alcohol use: Yes  . Drug use: No     Allergies   Patient has no known  allergies.   Review of Systems Review of Systems All other systems negative except as documented in the HPI. All pertinent positives and negatives as reviewed in the HPI.  Physical Exam Updated Vital Signs BP 110/83   Pulse (!) 119   Temp (!) 97.2 F (36.2 C) (Oral)   Resp (!) 21   Ht 5\' 11"  (1.803 m)   Wt 100.8 kg (222 lb 5 oz)   SpO2 92%   BMI 31.01 kg/m   Physical Exam  Constitutional: He appears well-developed and well-nourished. No distress.  HENT:  Head: Normocephalic and atraumatic.  Mouth/Throat: Oropharynx is clear and moist.  Eyes: Pupils are equal, round, and reactive to light.  Neck: Normal range of motion. Neck supple.  Cardiovascular: Normal rate, regular rhythm and normal heart sounds. Exam reveals no gallop and no friction rub.  No murmur heard. Pulmonary/Chest: Effort normal and breath sounds normal. No respiratory distress. He has no wheezes.  Abdominal: Soft. Bowel sounds are normal. He exhibits no distension. There is no tenderness.  Neurological: He is alert. He exhibits normal muscle tone. Coordination normal.  Patient seems somewhat confused at times but then other times seems to be responding appropriately.  Skin: Skin is warm and dry. Capillary refill takes less than 2 seconds. No rash noted. No erythema.  Psychiatric: He has a normal mood and affect. His behavior is normal.  Nursing note and vitals  reviewed.    ED Treatments / Results  Labs (all labs ordered are listed, but only abnormal results are displayed) Labs Reviewed  CBC - Abnormal; Notable for the following components:      Result Value   RBC 4.09 (*)    Hemoglobin 12.9 (*)    All other components within normal limits  COMPREHENSIVE METABOLIC PANEL - Abnormal; Notable for the following components:   Glucose, Bld 112 (*)    BUN 29 (*)    Creatinine, Ser 1.62 (*)    GFR calc non Af Amer 36 (*)    GFR calc Af Amer 42 (*)    All other components within normal limits  I-STAT  TROPONIN, ED - Abnormal; Notable for the following components:   Troponin i, poc 0.15 (*)    All other components within normal limits  I-STAT CHEM 8, ED - Abnormal; Notable for the following components:   BUN 31 (*)    Creatinine, Ser 1.50 (*)    Glucose, Bld 108 (*)    Calcium, Ion 1.13 (*)    All other components within normal limits  URINE CULTURE  PROTIME-INR  APTT  DIFFERENTIAL  BRAIN NATRIURETIC PEPTIDE  URINALYSIS, ROUTINE W REFLEX MICROSCOPIC  HEMOGLOBIN A1C  LIPID PANEL  TROPONIN I  TROPONIN I  TROPONIN I  TSH  AMMONIA    EKG EKG Interpretation  Date/Time:  Wednesday November 30 2017 18:33:39 EDT Ventricular Rate:  126 PR Interval:    QRS Duration: 87 QT Interval:  322 QTC Calculation: 467 R Axis:   29 Text Interpretation:  Atrial fibrillation ST depression, probably rate related Confirmed by Lorre NickAllen, Anthony ([id number]) on 11/30/2017 8:05:31 PM   Radiology Ct Head Wo Contrast  Result Date: 11/30/2017 CLINICAL DATA:  Slurred speech and increased confusion with loss of balance for 2-3 weeks. EXAM: CT HEAD WITHOUT CONTRAST TECHNIQUE: Contiguous axial images were obtained from the base of the skull through the vertex without intravenous contrast. COMPARISON:  None. FINDINGS: BRAIN: There is moderate sulcal and ventricular prominence consistent with superficial and central atrophy. Left caudate head lacunar infarct is noted, age indeterminate but likely chronic. No intraparenchymal hemorrhage, mass effect nor midline shift. Mild-to-moderate periventricular and subcortical white matter hypodensities consistent with chronic small vessel ischemic disease are identified. No acute large vascular territory infarcts. No abnormal extra-axial fluid collections. Basal cisterns are not effaced and midline. VASCULAR: Moderate calcific atherosclerosis of the carotid siphons and both middle cerebral arteries. SKULL: No skull fracture. No significant scalp soft tissue swelling. SINUSES/ORBITS:  The mastoid air-cells are clear. The included paranasal sinuses are well-aerated.The included ocular globes and orbital contents are non-suspicious. OTHER: Right posterior parietal scalp contusion is noted. IMPRESSION: 1. Right parietal scalp contusion without underlying skull fracture. 2. Moderate superficial and central atrophy with small vessel ischemia. Small left caudate head lacunar infarct. 3. Dense atherosclerotic calcifications at the skull base as above. 4. No acute intracranial abnormality identified on CT. Electronically Signed   By: Tollie Ethavid  Kwon M.D.   On: 11/30/2017 18:26   Mr Brain Wo Contrast (neuro Protocol)  Result Date: 11/30/2017 CLINICAL DATA:  Slurred speech and altered mental status EXAM: MRI HEAD WITHOUT CONTRAST TECHNIQUE: Multiplanar, multiecho pulse sequences of the brain and surrounding structures were obtained without intravenous contrast. COMPARISON:  Head CT 11/30/2017 FINDINGS: The study is degraded by motion, despite efforts to reduce this artifact, including utilization of motion-resistant MR sequences. The findings of the study are interpreted in the context of reduced sensitivity/specificity. BRAIN: There  is no acute infarct, acute hemorrhage or mass effect. The midline structures are normal. There is an old right frontal lobe infarct. Minimal white matter hyperintensity, nonspecific and commonly seen in asymptomatic patients of this age. Generalized atrophy without lobar predilection. Susceptibility-sensitive sequences show no chronic microhemorrhage or superficial siderosis. VASCULAR: Major intracranial arterial and venous sinus flow voids are preserved. SKULL AND UPPER CERVICAL SPINE: The visualized skull base, calvarium, upper cervical spine and extracranial soft tissues are normal. SINUSES/ORBITS: No fluid levels or advanced mucosal thickening. No mastoid or middle ear effusion. The orbits are normal. IMPRESSION: Old right frontal lobe infarct and generalized brain atrophy  without acute abnormality. Electronically Signed   By: Deatra Robinson M.D.   On: 11/30/2017 22:48    Procedures Procedures (including critical care time)  Medications Ordered in ED Medications  sodium chloride 0.9 % bolus 500 mL (has no administration in time range)  0.9 %  sodium chloride infusion (has no administration in time range)  aspirin EC tablet 325 mg (has no administration in time range)  atorvastatin (LIPITOR) tablet 80 mg (has no administration in time range)  nitroGLYCERIN (NITROSTAT) SL tablet 0.4 mg (has no administration in time range)  morphine 4 MG/ML injection 1 mg (has no administration in time range)  acetaminophen (TYLENOL) tablet 650 mg (has no administration in time range)  ondansetron (ZOFRAN) injection 4 mg (has no administration in time range)  zolpidem (AMBIEN) tablet 5 mg (has no administration in time range)  hydrALAZINE (APRESOLINE) injection 5 mg (has no administration in time range)  diltiazem (CARDIZEM) 100 mg in dextrose 5 % 100 mL (1 mg/mL) infusion (0 mg/hr Intravenous Stopped 11/30/17 2257)  LORazepam (ATIVAN) injection 1 mg (1 mg Intravenous Given 11/30/17 2222)  diltiazem (CARDIZEM) 100 mg in dextrose 5 % 100 mL (1 mg/mL) infusion (7.5 mg/hr Intravenous New Bag/Given 11/30/17 2256)     Initial Impression / Assessment and Plan / ED Course  I have reviewed the triage vital signs and the nursing notes.  Pertinent labs & imaging results that were available during my care of the patient were reviewed by me and considered in my medical decision making (see chart for details).     I spoke with cardiology along with the [facility] who will admit the patient for further evaluation and care.  Patient's been stable here in the emergency department did start Cardizem due to the fact that he is in atrial fibrillation.  Patient did have a slight bump in his troponin and felt this was due to demand ischemia.  CRITICAL CARE Performed by: Jamesetta Orleans  Pantelis Elgersma Total critical care time: 30 minutes Critical care time was exclusive of separately billable procedures and treating other patients. Critical care was necessary to treat or prevent imminent or life-threatening deterioration. Critical care was time spent personally by me on the following activities: development of treatment plan with patient and/or surrogate as well as nursing, discussions with consultants, evaluation of patient's response to treatment, examination of patient, obtaining history from patient or surrogate, ordering and performing treatments and interventions, ordering and review of laboratory studies, ordering and review of radiographic studies, pulse oximetry and re-evaluation of patient's condition.   Final Clinical Impressions(s) / ED Diagnoses   Final diagnoses:  None    ED Discharge Orders    None       Kyra Manges 11/30/17 2344    Lorre Nick, MD 12/01/17 [phone]

## 2017-12-01 ENCOUNTER — Encounter (HOSPITAL_COMMUNITY): Payer: Self-pay | Admitting: General Practice

## 2017-12-01 ENCOUNTER — Inpatient Hospital Stay (HOSPITAL_COMMUNITY): Payer: Medicare HMO

## 2017-12-01 DIAGNOSIS — E1121 Type 2 diabetes mellitus with diabetic nephropathy: Secondary | ICD-10-CM

## 2017-12-01 DIAGNOSIS — I4891 Unspecified atrial fibrillation: Principal | ICD-10-CM

## 2017-12-01 DIAGNOSIS — R748 Abnormal levels of other serum enzymes: Secondary | ICD-10-CM

## 2017-12-01 DIAGNOSIS — R4182 Altered mental status, unspecified: Secondary | ICD-10-CM

## 2017-12-01 DIAGNOSIS — R079 Chest pain, unspecified: Secondary | ICD-10-CM

## 2017-12-01 DIAGNOSIS — N183 Chronic kidney disease, stage 3 (moderate): Secondary | ICD-10-CM

## 2017-12-01 DIAGNOSIS — E785 Hyperlipidemia, unspecified: Secondary | ICD-10-CM

## 2017-12-01 DIAGNOSIS — G9341 Metabolic encephalopathy: Secondary | ICD-10-CM

## 2017-12-01 LAB — MAGNESIUM: MAGNESIUM: 2 mg/dL (ref 1.7–2.4)

## 2017-12-01 LAB — CBC WITH DIFFERENTIAL/PLATELET
ABS IMMATURE GRANULOCYTES: 0.1 10*3/uL (ref 0.0–0.1)
BASOS ABS: 0.1 10*3/uL (ref 0.0–0.1)
BASOS PCT: 1 %
Eosinophils Absolute: 0.2 10*3/uL (ref 0.0–0.7)
Eosinophils Relative: 2 %
HCT: 39.1 % (ref 39.0–52.0)
HEMOGLOBIN: 12.2 g/dL — AB (ref 13.0–17.0)
IMMATURE GRANULOCYTES: 1 %
LYMPHS PCT: 14 %
Lymphs Abs: 1.1 10*3/uL (ref 0.7–4.0)
MCH: 31.6 pg (ref 26.0–34.0)
MCHC: 31.2 g/dL (ref 30.0–36.0)
MCV: 101.3 fL — ABNORMAL HIGH (ref 78.0–100.0)
Monocytes Absolute: 0.9 10*3/uL (ref 0.1–1.0)
Monocytes Relative: 12 %
NEUTROS ABS: 5.2 10*3/uL (ref 1.7–7.7)
NEUTROS PCT: 70 %
Platelets: 216 10*3/uL (ref 150–400)
RBC: 3.86 MIL/uL — AB (ref 4.22–5.81)
RDW: 14.3 % (ref 11.5–15.5)
WBC: 7.5 10*3/uL (ref 4.0–10.5)

## 2017-12-01 LAB — LIPID PANEL
CHOLESTEROL: 127 mg/dL (ref 0–200)
HDL: 46 mg/dL (ref >40)
LDL Cholesterol: 75 mg/dL (ref 0–99)
TRIGLYCERIDES: 32 mg/dL (ref <150)
Total CHOL/HDL Ratio: 2.8 RATIO
VLDL: 6 mg/dL (ref 0–40)

## 2017-12-01 LAB — BASIC METABOLIC PANEL
ANION GAP: 9 (ref 5–15)
BUN: 25 mg/dL — ABNORMAL HIGH (ref 6–20)
CALCIUM: 8.8 mg/dL — AB (ref 8.9–10.3)
CO2: 24 mmol/L (ref 22–32)
Chloride: 106 mmol/L (ref 101–111)
Creatinine, Ser: 1.4 mg/dL — ABNORMAL HIGH (ref 0.61–1.24)
GFR, EST AFRICAN AMERICAN: 50 mL/min — AB (ref >60)
GFR, EST NON AFRICAN AMERICAN: 43 mL/min — AB (ref >60)
GLUCOSE: 85 mg/dL (ref 65–99)
POTASSIUM: 4.7 mmol/L (ref 3.5–5.1)
Sodium: 139 mmol/L (ref 135–145)

## 2017-12-01 LAB — TROPONIN I
Troponin I: 0.11 ng/mL (ref <0.03)
Troponin I: 0.13 ng/mL (ref <0.03)
Troponin I: 0.14 ng/mL (ref <0.03)

## 2017-12-01 LAB — URINALYSIS, ROUTINE W REFLEX MICROSCOPIC
Bacteria, UA: NONE SEEN
Bilirubin Urine: NEGATIVE
GLUCOSE, UA: NEGATIVE mg/dL
HGB URINE DIPSTICK: NEGATIVE
Ketones, ur: NEGATIVE mg/dL
Leukocytes, UA: NEGATIVE
NITRITE: NEGATIVE
PH: 5 (ref 5.0–8.0)
PROTEIN: 30 mg/dL — AB
SPECIFIC GRAVITY, URINE: 1.024 (ref 1.005–1.030)

## 2017-12-01 LAB — VITAMIN B12: Vitamin B-12: 595 pg/mL (ref 180–914)

## 2017-12-01 LAB — CBG MONITORING, ED
GLUCOSE-CAPILLARY: 77 mg/dL (ref 65–99)
GLUCOSE-CAPILLARY: 79 mg/dL (ref 65–99)

## 2017-12-01 LAB — HEPARIN LEVEL (UNFRACTIONATED)
HEPARIN UNFRACTIONATED: 0.61 [IU]/mL (ref 0.30–0.70)
Heparin Unfractionated: 0.47 IU/mL (ref 0.30–0.70)

## 2017-12-01 LAB — RPR: RPR: NONREACTIVE

## 2017-12-01 LAB — ECHOCARDIOGRAM COMPLETE
Height: 71 in
Weight: 3557 oz

## 2017-12-01 LAB — HEMOGLOBIN A1C
HEMOGLOBIN A1C: 5.2 % (ref 4.8–5.6)
MEAN PLASMA GLUCOSE: 102.54 mg/dL

## 2017-12-01 LAB — AMMONIA: AMMONIA: 34 umol/L (ref 9–35)

## 2017-12-01 LAB — BRAIN NATRIURETIC PEPTIDE: B Natriuretic Peptide: 632.6 pg/mL — ABNORMAL HIGH (ref 0.0–100.0)

## 2017-12-01 LAB — TSH: TSH: 3.931 u[IU]/mL (ref 0.350–4.500)

## 2017-12-01 LAB — GLUCOSE, CAPILLARY: GLUCOSE-CAPILLARY: 138 mg/dL — AB (ref 65–99)

## 2017-12-01 LAB — MRSA PCR SCREENING: MRSA by PCR: NEGATIVE

## 2017-12-01 MED ORDER — DILTIAZEM HCL 60 MG PO TABS
60.0000 mg | ORAL_TABLET | Freq: Four times a day (QID) | ORAL | Status: DC
  Administered 2017-12-01 – 2017-12-02 (×3): 60 mg via ORAL
  Filled 2017-12-01 (×4): qty 1

## 2017-12-01 MED ORDER — DILTIAZEM HCL 60 MG PO TABS
30.0000 mg | ORAL_TABLET | Freq: Three times a day (TID) | ORAL | Status: DC
  Administered 2017-12-01 (×2): 30 mg via ORAL
  Filled 2017-12-01 (×3): qty 1

## 2017-12-01 NOTE — Progress Notes (Signed)
ANTICOAGULATION CONSULT NOTE - Follow-Up Consult  Pharmacy Consult for heparin Indication: atrial fibrillation  No Known Allergies  Patient Measurements: Height: 5\' 11"  (180.3 cm) Weight: 222 lb 5 oz (100.8 kg) IBW/kg (Calculated) : 75.3 Heparin Dosing Weight: 96.1 kg  Vital Signs: BP: 101/78 (06/13 1030) Pulse Rate: 106 (06/13 1030)  Labs: Recent Labs    11/30/17 1745 11/30/17 1751 11/30/17 2335 12/01/17 0356 12/01/17 1020  HGB 12.9* 13.6  --   --  12.2*  HCT 40.6 40.0  --   --  39.1  PLT 251  --   --   --  216  APTT 33  --   --   --   --   LABPROT 14.6  --   --   --   --   INR 1.15  --   --   --   --   HEPARINUNFRC  --   --   --   --  0.47  CREATININE 1.62* 1.50*  --   --  1.40*  TROPONINI  --   --  0.14* 0.13* 0.11*    Estimated Creatinine Clearance: 44.1 mL/min (A) (by C-G formula based on SCr of 1.4 mg/dL (H)).   Medical History: Past Medical History:  Diagnosis Date  . Dermatitis   . Diabetes mellitus   . Gout   . Hyperlipemia   . Obesity   . Peripheral neuropathy   . Pulmonary nodule     Medications:  See medication history, was not on anticoagulation prior to admission  Assessment: [age] yoM with new onset AFib with RVR. CHADSVASc = 3, CT head negative, CBC stable. Initial heparin level therapeutic at 0.47  Goal of Therapy:  Heparin level 0.3-0.7 units/ml Monitor platelets by anticoagulation protocol: Yes   Plan:  -Heparin 1150 units/hr -Check 8-hr confirmatory heparin level -F/U longterm OAC plan  Fredonia HighlandMichael Bitonti, PharmD, BCPS PGY-2 Cardiology Pharmacy Resident Phone: [phone] 12/01/2017

## 2017-12-01 NOTE — Progress Notes (Signed)
Physical Therapy Evaluation Patient Details Name: Albert [name] MRN: [id number] DOB: 1927-07-15 Today's Date: 12/01/2017   History of Present Illness  Patient is a 84 y/o male presenting with confusion, fall, poor balance, and chest pain. Admitted with acute metabolic encephalopathy with unclear etiology. Of note, patient with new onset A Fib with RVR. CT head is negative for acute intracranial abnormalities, but showed a small lacunar infarcts. Patient with a PMH significant for diet-controlled diabetes, hyperlipidemia, gout, obesity, CKD-3, currently not taking medications except for Aleve.Patient is a 84 y/o male presenting with confusion, fall, poor balance, and chest pain. Admitted with acute metabolic encephalopathy with unclear etiology. Of note, patient with new onset A Fib with RVR. CT head is negative for acute intracranial abnormalities, but showed a small lacunar infarcts. Patient with a PMH significant for diet-controlled diabetes, hyperlipidemia, gout, obesity, CKD-3, currently not taking medications except for Aleve.  Clinical Impression   [name] is a very pleasant 84 y/o male admitted with the above listed diagnosis. Patients son and wife present for session. Reports that prior to admission patient was able to ambulate within the home but required assist for ADLs from wife. Today patient requiring Mod A +2 for sit to stand and Min A + 2 for stand pivot transfer to recliner. Patient also with confusion during exam with poor safety awareness and reduced follow through with one-step commands. At this time, PT recommending short term SNF stay due to reduced functional mobility (see below), however patients wife interested in returning home. PT to follow acutely to maximize mobility and safety as able.     Follow Up Recommendations SNF;Supervision/Assistance - 24 hour    Equipment Recommendations  None recommended by PT    Recommendations for Other Services       Precautions /  Restrictions Precautions Precautions: Fall Restrictions Weight Bearing Restrictions: No      Mobility  Bed Mobility Overal bed mobility: Needs Assistance Bed Mobility: Supine to Sit     Supine to sit: Min assist     General bed mobility comments: for LE management; heavy verbal cueing for sequencing and safety as well as motivation to complete task  Transfers Overall transfer level: Needs assistance Equipment used: Rolling walker (2 wheeled) Transfers: Sit to/from [employer] Sit to Stand: Mod assist;+2 physical assistance Stand pivot transfers: Min assist;+2 physical assistance       General transfer comment: Mod A +2 to power up from elevated bed - heavy forward trunk flexion with standing with poor safety awareness with assistive device; Min A to manage RW  Ambulation/Gait                Stairs            Wheelchair Mobility    Modified Rankin (Stroke Patients Only)       Balance Overall balance assessment: Needs assistance Sitting-balance support: No upper extremity supported;Feet supported Sitting balance-Leahy Scale: Fair     Standing balance support: Bilateral upper extremity supported;During functional activity Standing balance-Leahy Scale: Poor                               Pertinent Vitals/Pain Pain Assessment: No/denies pain    Home Living Family/patient expects to be discharged to:: Private residence Living Arrangements: Spouse/significant other Available Help at Discharge: Family;Friend(s) Type of Home: House         Home Equipment: Dan Humphreys - 2 wheels Additional Comments: wife  reports patient was only ambulatory within the home    Prior Function Level of Independence: Needs assistance   Gait / Transfers Assistance Needed: RW - only within the home  ADL's / Homemaking Assistance Needed: wife assisting        Hand Dominance        Extremity/Trunk Assessment        Lower Extremity  Assessment Lower Extremity Assessment: Generalized weakness       Communication   Communication: HOH  Cognition Arousal/Alertness: Awake/alert Behavior During Therapy: WFL for tasks assessed/performed Overall Cognitive Status: Impaired/Different from baseline Area of Impairment: Orientation;Following commands;Safety/judgement;Problem solving                 Orientation Level: Disoriented to;Time;Situation     Following Commands: Follows one step commands with increased time Safety/Judgement: Decreased awareness of safety;Decreased awareness of deficits   Problem Solving: Slow processing;Decreased initiation;Difficulty sequencing;Requires verbal cues;Requires tactile cues        General Comments      Exercises     Assessment/Plan    PT Assessment Patient needs continued PT services  PT Problem List Decreased strength;Decreased activity tolerance;Decreased balance;Decreased mobility;Decreased coordination;Decreased knowledge of use of DME;Decreased cognition;Decreased safety awareness;Decreased knowledge of precautions       PT Treatment Interventions DME instruction;Gait training;Functional mobility training;Therapeutic activities;Therapeutic exercise;Balance training;Neuromuscular re-education;Cognitive remediation;Patient/family education    PT Goals (Current goals can be found in the Care Plan section)  Acute Rehab PT Goals Patient Stated Goal: none stated PT Goal Formulation: With patient Time For Goal Achievement: 12/15/17 Potential to Achieve Goals: Fair    Frequency Min 3X/week(for hopeful progression to home)   Barriers to discharge        Co-evaluation               AM-PAC PT "6 Clicks" Daily Activity  Outcome Measure Difficulty turning over in bed (including adjusting bedclothes, sheets and blankets)?: A Lot Difficulty moving from lying on back to sitting on the side of the bed? : Unable Difficulty sitting down on and standing up from a  chair with arms (e.g., wheelchair, bedside commode, etc,.)?: Unable Help needed moving to and from a bed to chair (including a wheelchair)?: A Little Help needed walking in hospital room?: A Lot Help needed climbing 3-5 steps with a railing? : A Lot 6 Click Score: 11    End of Session Equipment Utilized During Treatment: Gait belt Activity Tolerance: Patient tolerated treatment well Patient left: in chair;with call bell/phone within reach;with family/visitor present;with nursing/sitter in room Nurse Communication: Mobility status PT Visit Diagnosis: Unsteadiness on feet (R26.81);Other abnormalities of gait and mobility (R26.89);History of falling (Z91.81);Muscle weakness (generalized) (M62.81)    Time: [id number] PT Time Calculation (min) (ACUTE ONLY): 28 min   Charges:   PT Evaluation $PT Eval Moderate Complexity: 1 Mod     PT G Codes:        Kipp LaurenceStephanie R Aaron, PT, DPT 12/01/17 4:18 PM

## 2017-12-01 NOTE — Progress Notes (Signed)
  Echocardiogram 2D Echocardiogram has been performed.  Albert [name] 12/01/2017, 2:40 PM

## 2017-12-01 NOTE — Progress Notes (Addendum)
PROGRESS NOTE    UNO ESAU  ZOX:[id number] DOB: 1927/09/28 DOA: 11/30/2017 PCP: Martha Clan, MD    Brief Narrative:   Albert [name] is a 84 y.o. male with medical history significant of diet-controlled diabetes, hyperlipidemia, gout, obesity, CKD-3, currently not taking medications except for Aleve, who presented with confusion, poor balance, chest pain, palpitation and fall.  Per patient's son and wife, patient has been confused in the past 2 weeks.  Patient had broken tooth removed 2 weeks ago, since then he has been mildly confused.  She also has poor balance. He fell this AM, injured his right side of head on the door.  No LOC.  Patient does not have unilateral weakness or numbness in extremities.  No facial droop.  He seems to have mild slurred speech per his son.  He has chronic hearing loss, wearing hearing aid.  No vision loss.  Patient also has been having intermittent mild chest pain for about 1 week.  It is located in substernal area, dull, nonradiating.  It is exertional, happens when he moves around intermittently.  Currently patient does not have chest pain or shortness of breath, cough.  No fever or chills.  Patient does not have nausea, vomiting, diarrhea, abdominal pain, symptoms of UTI.  Patient was found to have new onset A. fib with RVR, with heart rate up to 130s, Cardizem drip is started in ED.     Assessment & Plan:   Principal Problem:   Acute metabolic encephalopathy Active Problems:   Atrial fibrillation (HCC)   Elevated troponin   Fall   Type II diabetes mellitus with renal manifestations (HCC)   HLD (hyperlipidemia)   CKD (chronic kidney disease), stage III (HCC)   Chest pain  #1 acute metabolic encephalopathy/fall Unclear etiology.  May be secondary to underlying dementia.  Patient on admission was noted to have some mild slurred speech poor balance however no other focal neurological findings.  CT head negative for any acute intracranial abnormalities  however did show some small lacunar infarcts.  MRI brain was negative for any acute abnormalities, old right frontal lobe infarct, and generalized brain atrophy without acute abnormality.  Is unremarkable.  Chest x-ray unremarkable.  No signs of any acute infection.  TSH within normal limits at 3.931.  Nonreactive.  Vitamin B12 levels at 595.  Ammonia level was 34.  PT/OT.  Supportive care.  Follow.  2.  New onset atrial fibrillation Patient noted to be new onset A. fib on admission with heart rates in the 130s and started on a Cardizem drip in the ED.  Cardiac enzymes mildly elevated going from 0.15 to 0.14-0.13 to 0.11.  BNP was 632.6 however patient clinically dry.  TSH within normal limits.  2D echo pending.  Patient currently on oral Cardizem at 30 mg every 8 hours.  Increase dose to 60 mg every 6 hours for better rate control.  Patient on IV heparin for anticoagulation.  Cardiology consulted and are following.  3.  Chest pain and elevated troponin Troponin seems to be slowly trending down.  EKG with no significant ischemic changes.  Fasting lipid panel with a LDL of 75.  A1c 5.2.  2D echo pending.  Cardiology following and I appreciate the input and recommendations.  4.  Chronic kidney disease stage III (baseline creatinine 1.5-1.7) Stable.  5.  Diabetes mellitus type 2 Hemoglobin A1c was 5.2.  Per family patient has been taken off oral hypoglycemic agents.  Discontinue daily CBGs.  Follow.  6.  Hyperlipidemia LDL at 75.  Patient started on a statin.     DVT prophylaxis: Heparin Code Status: Partial Family Communication: Updated patient and wife at bedside. Disposition Plan: To be determined.   Consultants:   Cardiology: Dr. Deforest HoylesAkhter 12/01/2017  Procedures:   2D echo pending 12/01/2017  X-ray 12/01/2017  ChestT head 11/30/2017  MRI brain 11/30/2017  Antimicrobials:   None   Subjective: Patient in bed.  Just finished getting an echo.  Denies any chest pain no shortness of  breath.  Objective: Vitals:   12/01/17 1200 12/01/17 1230 12/01/17 1330 12/01/17 1511  BP: 92/78 93/81 104/79 114/89  Pulse: (!) 119 (!) 109 (!) 127   Resp: 19 16 15    Temp:      TempSrc:      SpO2: 94% 94% 94%   Weight:   99.7 kg (219 lb 12.8 oz)   Height:        Intake/Output Summary (Last 24 hours) at 12/01/2017 1833 Last data filed at 12/01/2017 1500 Gross per 24 hour  Intake 787.34 ml  Output 500 ml  Net 287.34 ml   Filed Weights   11/30/17 1735 12/01/17 1330  Weight: 100.8 kg (222 lb 5 oz) 99.7 kg (219 lb 12.8 oz)    Examination:  General exam: Appears calm and comfortable  Respiratory system: Clear to auscultation. Respiratory effort normal. Cardiovascular system: Irregularly irregular.  No lower extremity edema.  No JVD.  No rubs gallops or murmurs.  Gastrointestinal system: Abdomen is soft, nontender, nondistended, positive bowel sounds.  No rebound.  No guarding.  Central nervous system: Alert and oriented. No focal neurological deficits. Extremities: Symmetric 5 x 5 power. Skin: No rashes, lesions or ulcers Psychiatry: Judgement and insight appear normal. Mood & affect appropriate.     Data Reviewed: I have personally reviewed following labs and imaging studies  CBC: Recent Labs  Lab 11/30/17 1745 11/30/17 1751 12/01/17 1020  WBC 9.0  --  7.5  NEUTROABS 6.7  --  5.2  HGB 12.9* 13.6 12.2*  HCT 40.6 40.0 39.1  MCV 99.3  --  101.3*  PLT 251  --  216   Basic Metabolic Panel: Recent Labs  Lab 11/30/17 1745 11/30/17 1751 12/01/17 1020  NA 138 137 139  K 4.9 4.8 4.7  CL 103 101 106  CO2 26  --  24  GLUCOSE 112* 108* 85  BUN 29* 31* 25*  CREATININE 1.62* 1.50* 1.40*  CALCIUM 9.4  --  8.8*  MG  --   --  2.0   GFR: Estimated Creatinine Clearance: 43.9 mL/min (A) (by C-G formula based on SCr of 1.4 mg/dL (H)). Liver Function Tests: Recent Labs  Lab 11/30/17 1745  AST 26  ALT 22  ALKPHOS 60  BILITOT 0.9  PROT 6.7  ALBUMIN 3.6   No  results for input(s): LIPASE, AMYLASE in the last 168 hours. Recent Labs  Lab 11/30/17 2335  AMMONIA 34   Coagulation Profile: Recent Labs  Lab 11/30/17 1745  INR 1.15   Cardiac Enzymes: Recent Labs  Lab 11/30/17 2335 12/01/17 0356 12/01/17 1020  TROPONINI 0.14* 0.13* 0.11*   BNP (last 3 results) No results for input(s): PROBNP in the last 8760 hours. HbA1C: Recent Labs    12/01/17 0356  HGBA1C 5.2   CBG: Recent Labs  Lab 12/01/17 0636 12/01/17 0810  GLUCAP 77 79   Lipid Profile: Recent Labs    12/01/17 0356  CHOL 127  HDL 46  LDLCALC 75  TRIG 32  CHOLHDL 2.8   Thyroid Function Tests: Recent Labs    12/01/17 0356  TSH 3.931   Anemia Panel: Recent Labs    12/01/17 0356  VITAMINB12 595   Sepsis Labs: No results for input(s): PROCALCITON, LATICACIDVEN in the last 168 hours.  Recent Results (from the past 240 hour(s))  MRSA PCR Screening     Status: None   Collection Time: 12/01/17  1:18 PM  Result Value Ref Range Status   MRSA by PCR NEGATIVE NEGATIVE Final    Comment:        The GeneXpert MRSA Assay (FDA approved for NASAL specimens only), is one component of a comprehensive MRSA colonization surveillance program. It is not intended to diagnose MRSA infection nor to guide or monitor treatment for MRSA infections. Performed at [facility], [address], [place], [place] [address]          Radiology Studies: Ct Head Wo Contrast  Result Date: 11/30/2017 CLINICAL DATA:  Slurred speech and increased confusion with loss of balance for 2-3 weeks. EXAM: CT HEAD WITHOUT CONTRAST TECHNIQUE: Contiguous axial images were obtained from the base of the skull through the vertex without intravenous contrast. COMPARISON:  None. FINDINGS: BRAIN: There is moderate sulcal and ventricular prominence consistent with superficial and central atrophy. Left caudate head lacunar infarct is noted, age indeterminate but likely chronic. No  intraparenchymal hemorrhage, mass effect nor midline shift. Mild-to-moderate periventricular and subcortical white matter hypodensities consistent with chronic small vessel ischemic disease are identified. No acute large vascular territory infarcts. No abnormal extra-axial fluid collections. Basal cisterns are not effaced and midline. VASCULAR: Moderate calcific atherosclerosis of the carotid siphons and both middle cerebral arteries. SKULL: No skull fracture. No significant scalp soft tissue swelling. SINUSES/ORBITS: The mastoid air-cells are clear. The included paranasal sinuses are well-aerated.The included ocular globes and orbital contents are non-suspicious. OTHER: Right posterior parietal scalp contusion is noted. IMPRESSION: 1. Right parietal scalp contusion without underlying skull fracture. 2. Moderate superficial and central atrophy with small vessel ischemia. Small left caudate head lacunar infarct. 3. Dense atherosclerotic calcifications at the skull base as above. 4. No acute intracranial abnormality identified on CT. Electronically Signed   By: Tollie Eth M.D.   On: 11/30/2017 18:26   Mr Brain Wo Contrast (neuro Protocol)  Result Date: 11/30/2017 CLINICAL DATA:  Slurred speech and altered mental status EXAM: MRI HEAD WITHOUT CONTRAST TECHNIQUE: Multiplanar, multiecho pulse sequences of the brain and surrounding structures were obtained without intravenous contrast. COMPARISON:  Head CT 11/30/2017 FINDINGS: The study is degraded by motion, despite efforts to reduce this artifact, including utilization of motion-resistant MR sequences. The findings of the study are interpreted in the context of reduced sensitivity/specificity. BRAIN: There is no acute infarct, acute hemorrhage or mass effect. The midline structures are normal. There is an old right frontal lobe infarct. Minimal white matter hyperintensity, nonspecific and commonly seen in asymptomatic patients of this age. Generalized atrophy  without lobar predilection. Susceptibility-sensitive sequences show no chronic microhemorrhage or superficial siderosis. VASCULAR: Major intracranial arterial and venous sinus flow voids are preserved. SKULL AND UPPER CERVICAL SPINE: The visualized skull base, calvarium, upper cervical spine and extracranial soft tissues are normal. SINUSES/ORBITS: No fluid levels or advanced mucosal thickening. No mastoid or middle ear effusion. The orbits are normal. IMPRESSION: Old right frontal lobe infarct and generalized brain atrophy without acute abnormality. Electronically Signed   By: Deatra Robinson M.D.   On: 11/30/2017 22:48   Dg Chest  Port 1 View  Result Date: 12/01/2017 CLINICAL DATA:  Increasing fatigue over 2 weeks EXAM: PORTABLE CHEST 1 VIEW COMPARISON:  02/26/2009 FINDINGS: Cardiac shadow is mildly enlarged but stable. Aortic calcifications are seen. Vascular congestion and mild edema is identified consistent with CHF. No sizable effusion is noted. No bony abnormality is seen. IMPRESSION: Changes consistent with CHF. Electronically Signed   By: Alcide Clever M.D.   On: 12/01/2017 09:53        Scheduled Meds: . aspirin EC  325 mg Oral Daily  . atorvastatin  80 mg Oral q1800  . diltiazem  30 mg Oral Q8H   Continuous Infusions: . heparin 1,150 Units/hr (12/01/17 1814)     LOS: 1 day    Time spent: 35 minutes    Ramiro Harvest, MD [facility] Pager [phone]  If 7PM-7AM, please contact night-coverage [email or web address] Password [facility] 12/01/2017, 6:33 PM

## 2017-12-01 NOTE — ED Notes (Signed)
Attempted report 

## 2017-12-01 NOTE — ED Notes (Signed)
PAGED Dr. Youlanda RoysNui to [place], RN

## 2017-12-01 NOTE — Consult Note (Signed)
[facility] Consult Note   Primary Physician: Primary Cardiologist:    Reason for Consultation: Newly diagnosed atrial fibrillation  HPI:    Albert [name] is seen today in the Emergency department for a new diagnosis of atrial fibrillation. The patient is 84 years old and has a past history of mild dementia, gout, hyperlipidemia, and a pulmonary nodule. He has reported increasing somnolence and fatigue for the past 2 weeks. His wife states that "he has been looking ill". Even though he has some mild cognitive impairment he has been able to walk outside to get the newspaper till recently. However in the past few days he has been increasingly forgetful. He also sometimes struggles to catch his breath. He sustained a ground level fall in the morning (yesterday) as he was returning from the bathroom. No obvious trauma other than a lump on the head.  He was evaluated in his PCPs clinic. An ECG revealed atrial fibrillation and he was therefore advised to come to the hospital ED.  The patient's wife provided much of the history documented above.   Review of Systems: [y] = yes, [ ]  = no ..................... Unable to accurately assess since the patient cannot give a history  . General: Weight gain [ ] ; Weight loss [ ] ; Anorexia [ ] ; Fatigue [ ] ; Fever [ ] ; Chills [ ] ; Weakness [ ]   . Cardiac: Chest pain/pressure [ ] ; Resting SOB [ ] ; Exertional SOB [ ] ; Orthopnea [ ] ; Pedal Edema [ ] ; Palpitations [ ] ; Syncope [ ] ; Presyncope [ ] ; Paroxysmal nocturnal dyspnea[ ]   . Pulmonary: Cough [ ] ; Wheezing[ ] ; Hemoptysis[ ] ; Sputum [ ] ; Snoring [ ]   . GI: Vomiting[ ] ; Dysphagia[ ] ; Melena[ ] ; Hematochezia [ ] ; Heartburn[ ] ; Abdominal pain [ ] ; Constipation [ ] ; Diarrhea [ ] ; BRBPR [ ]   . GU: Hematuria[ ] ; Dysuria [ ] ; Nocturia[ ]   . Vascular: Pain in legs with walking [ ] ; Pain in feet with lying flat [ ] ; Non-healing sores [ ] ; Stroke [ ] ; TIA [ ] ; Slurred speech [ ] ;  . Neuro: Headaches[ ] ; Vertigo[  ]; Seizures[ ] ; Paresthesias[ ] ;Blurred vision [ ] ; Diplopia [ ] ; Vision changes [ ]   . Ortho/Skin: Arthritis [ ] ; Joint pain [ ] ; Muscle pain [ ] ; Joint swelling [ ] ; Back Pain [ ] ; Rash [ ]   . Psych: Depression[ ] ; Anxiety[ ]   . Heme: Bleeding problems [ ] ; Clotting disorders [ ] ; Anemia [ ]   . Endocrine: Diabetes [ ] ; Thyroid dysfunction[ ]   Home Medications Prior to Admission medications   Not on File    Past Medical History: Past Medical History:  Diagnosis Date  . Dermatitis   . Diabetes mellitus   . Gout   . Hyperlipemia   . Obesity   . Peripheral neuropathy   . Pulmonary nodule     Past Surgical History: Past Surgical History:  Procedure Laterality Date  . FINGER SURGERY      Family History: Family History  Problem Relation Age of Onset  . Colon cancer Neg Hx     Social History: Lives with wife. Needs help with ADLs such as dressing himself and going to the bathroom  Social History   Socioeconomic History  . Marital status: Married    Spouse name: Not on file  . Number of children: 3  . Years of education: Not on file  . Highest education level: Not on file  Occupational History  . Occupation: Retired  Chief Executive Officerocial  Needs  . Financial resource strain: Not on file  . Food insecurity:    Worry: Not on file    Inability: Not on file  . Transportation needs:    Medical: Not on file    Non-medical: Not on file  Tobacco Use  . Smoking status: Never Smoker  . Smokeless tobacco: Never Used  Substance and Sexual Activity  . Alcohol use: Yes  . Drug use: No  . Sexual activity: Not on file  Lifestyle  . Physical activity:    Days per week: Not on file    Minutes per session: Not on file  . Stress: Not on file  Relationships  . Social connections:    Talks on phone: Not on file    Gets together: Not on file    Attends religious service: Not on file    Active member of club or organization: Not on file    Attends meetings of clubs or organizations: Not on  file    Relationship status: Not on file  Other Topics Concern  . Not on file  Social History Narrative  . Not on file    Allergies:  No Known Allergies  Objective:    Vital Signs:   Temp:  [97.2 F (36.2 C)-98.5 F (36.9 C)] 97.2 F (36.2 C) (06/12 1838) Pulse Rate:  [55-133] 96 (06/13 0200) Resp:  [16-32] 23 (06/13 0330) BP: (93-124)/(60-103) 114/103 (06/13 0330) SpO2:  [87 %-100 %] 95 % (06/13 0215) Weight:  [100.8 kg (222 lb 5 oz)] 100.8 kg (222 lb 5 oz) (06/12 1735)    Weight change: Filed Weights   11/30/17 1735  Weight: 100.8 kg (222 lb 5 oz)    Intake/Output:   Intake/Output Summary (Last 24 hours) at 12/01/2017 0438 Last data filed at 12/01/2017 0318 Gross per 24 hour  Intake 547.34 ml  Output -  Net 547.34 ml      Physical Exam    General:  Well appearing. No resp difficulty HEENT: normal Neck: supple. JVP . Carotids 2+ bilat; no bruits. No lymphadenopathy or thyromegaly appreciated. Cor: PMI nondisplaced. Irregularly irregular rhythm, No rubs, gallops or murmurs. Lungs: clear Abdomen: soft, nontender, nondistended. No hepatosplenomegaly. No bruits or masses. Good bowel sounds. Extremities: no cyanosis, clubbing, rash, edema Neuro: alert & orientedx3, cranial nerves grossly intact. moves all 4 extremities w/o difficulty. Affect pleasant   Telemetry   Atrial fibrillation with a ventricular rate 90s-110s  EKG    Atrial fibrillation, q waves V1 and V2  Labs   Basic Metabolic Panel: Recent Labs  Lab 11/30/17 1745 11/30/17 1751  NA 138 137  K 4.9 4.8  CL 103 101  CO2 26  --   GLUCOSE 112* 108*  BUN 29* 31*  CREATININE 1.62* 1.50*  CALCIUM 9.4  --     Liver Function Tests: Recent Labs  Lab 11/30/17 1745  AST 26  ALT 22  ALKPHOS 60  BILITOT 0.9  PROT 6.7  ALBUMIN 3.6   No results for input(s): LIPASE, AMYLASE in the last 168 hours. Recent Labs  Lab 11/30/17 2335  AMMONIA 34    CBC: Recent Labs  Lab 11/30/17 1745  11/30/17 1751  WBC 9.0  --   NEUTROABS 6.7  --   HGB 12.9* 13.6  HCT 40.6 40.0  MCV 99.3  --   PLT 251  --     Cardiac Enzymes: Recent Labs  Lab 11/30/17 2335  TROPONINI 0.14*    BNP: BNP (last 3 results) Recent  Labs    11/30/17 2335  BNP 632.6*    ProBNP (last 3 results) No results for input(s): PROBNP in the last 8760 hours.   CBG: No results for input(s): GLUCAP in the last 168 hours.  Coagulation Studies: Recent Labs    11/30/17 1745  LABPROT 14.6  INR 1.15     Imaging   Ct Head Wo Contrast  Result Date: 11/30/2017 FINDINGS:  BRAIN: There is moderate sulcal and ventricular prominence consistent with superficial and central atrophy. Left caudate head lacunar infarct is noted, age indeterminate but likely chronic. No intraparenchymal hemorrhage, mass effect nor midline shift. Mild-to-moderate periventricular and subcortical white matter hypodensities consistent with chronic small vessel ischemic disease are identified. No acute large vascular territory infarcts. No abnormal extra-axial fluid collections. Basal cisterns are not effaced and midline.   VASCULAR: Moderate calcific atherosclerosis of the carotid siphons and both middle cerebral arteries.   SKULL: No skull fracture. No significant scalp soft tissue swelling.   SINUSES/ORBITS: The mastoid air-cells are clear. The included paranasal sinuses are well-aerated.The included ocular globes and orbital contents are non-suspicious.   OTHER: Right posterior parietal scalp contusion is noted.   IMPRESSION: 1. Right parietal scalp contusion without underlying skull fracture. 2. Moderate superficial and central atrophy with small vessel ischemia. Small left caudate head lacunar infarct. 3. Dense atherosclerotic calcifications at the skull base as above. 4. No acute intracranial abnormality identified on CT.   Mr Brain Wo Contrast (neuro Protocol)  Result Date: 11/30/2017 FINDINGS: The study is degraded by  motion, despite efforts to reduce this artifact, including utilization of motion-resistant MR sequences. The findings of the study are interpreted in the context of reduced sensitivity/specificity.  BRAIN: There is no acute infarct, acute hemorrhage or mass effect. The midline structures are normal. There is an old right frontal lobe infarct. Minimal white matter hyperintensity, nonspecific and commonly seen in asymptomatic patients of this age. Generalized atrophy without lobar predilection. Susceptibility-sensitive sequences show no chronic microhemorrhage or superficial siderosis.  VASCULAR: Major intracranial arterial and venous sinus flow voids are preserved.  SKULL AND UPPER CERVICAL SPINE: The visualized skull base, calvarium, upper cervical spine and extracranial soft tissues are normal.  SINUSES/ORBITS: No fluid levels or advanced mucosal thickening. No mastoid or middle ear effusion. The orbits are normal.  IMPRESSION: Old right frontal lobe infarct and generalized brain atrophy without acute abnormality.      Medications:     Current Medications: . aspirin EC  325 mg Oral Daily  . atorvastatin  80 mg Oral q1800  . diltiazem  30 mg Oral Q8H     Infusions: . heparin 1,150 Units/hr (12/01/17 0055)      Assessment/Plan   1. Newly diagnosed atrial fibrillation  The patient is currently rate controlled and in atrial fibrillation. It is unclear whether this rhythm is new for the patient. There is some evidence that his functional status declined over the past 2 weeks. His CHADS2-VASc score is 3 (HTN and age >51). If the head imaging does not reveal an acute bleed then the patient should be started on anticoagulation (preferably IV heparin).  Given the patient's age and co-morbidities we might pursue a rate-control strategy only.  There is no clinical evidence of heart failure. The BNP is noted to be elevated.  2. Altered mental status  CT and MRI negative for acute  pathology. MRI revealed old right frontal lobe infarct. Prior to admission was noted to have declining cognitive function (? Early dementia). Check for  metabolic causes of encephalopathy.  3. Elevated troponin (0.14)  Obtain a transthoracic echocardiogram to rule out structural heart disease. Likely mechanisms for elevated troponin could include demand ischemia or elevated LV filling pressures (with sub-endocardial ischemia)  4. Chronic kidney disease  Monitor serum creatinine and GFR  5. Hyperlipidemia  Continue statins    Length of Stay: 1  Lonie Peak, MD  12/01/2017, 4:38 AM   Please contact [facility] for night-coverage after hours (4p -7a ) and weekends on [email or web address]

## 2017-12-01 NOTE — Progress Notes (Signed)
ANTICOAGULATION CONSULT NOTE - Follow-Up Consult  Pharmacy Consult for heparin Indication: atrial fibrillation  No Known Allergies  Patient Measurements: Height: 5\' 11"  (180.3 cm) Weight: 219 lb 12.8 oz (99.7 kg) IBW/kg (Calculated) : 75.3 Heparin Dosing Weight: 96.1 kg  Vital Signs: BP: 114/89 (06/13 1511) Pulse Rate: 127 (06/13 1330)  Labs: Recent Labs    11/30/17 1745 11/30/17 1751 11/30/17 2335 12/01/17 0356 12/01/17 1020 12/01/17 1700  HGB 12.9* 13.6  --   --  12.2*  --   HCT 40.6 40.0  --   --  39.1  --   PLT 251  --   --   --  216  --   APTT 33  --   --   --   --   --   LABPROT 14.6  --   --   --   --   --   INR 1.15  --   --   --   --   --   HEPARINUNFRC  --   --   --   --  0.47 0.61  CREATININE 1.62* 1.50*  --   --  1.40*  --   TROPONINI  --   --  0.14* 0.13* 0.11*  --     Estimated Creatinine Clearance: 43.9 mL/min (A) (by C-G formula based on SCr of 1.4 mg/dL (H)).   Medical History: Past Medical History:  Diagnosis Date  . Arthritis    "pain in my joints" (12/01/2017)  . Atrial fibrillation (HCC)   . Dermatitis   . Gout X1  . History of kidney stones   . Hyperlipemia   . Obesity   . Peripheral neuropathy   . Pulmonary nodule   . Type 2 diabetes, diet controlled (HCC)     Medications:  See medication history, was not on anticoagulation prior to admission  Assessment: [age] yoM with new onset AFib with RVR. CHADSVASc = 3, CT head negative, CBC stable. Initial heparin level therapeutic at 0.47  Heparin level remains at goal this evening.  No overt bleeding or complications noted.  Goal of Therapy:  Heparin level 0.3-0.7 units/ml Monitor platelets by anticoagulation protocol: Yes   Plan:  -Continue Heparin 1150 units/hr -Daily heparin level and CBC -F/U longterm OAC plan  Jenetta DownerJessica Samanatha Brammer, Pharm D, BCPS, [facility] Clinical Pharmacist Pager [phone]  12/01/2017 7:07 PM

## 2017-12-01 NOTE — ED Notes (Signed)
Dr. Clyde LundborgNiu paged. Provider made aware of Pt's HR 90-105. Provider stated Cardizem may be changed to oral.

## 2017-12-01 NOTE — Plan of Care (Signed)
  Problem: Activity: Goal: Risk for activity intolerance will decrease Outcome: Progressing   Problem: Clinical Measurements: Goal: Respiratory complications will improve Outcome: Completed/Met

## 2017-12-01 NOTE — ED Notes (Signed)
Dr. Clyde LundborgNiu made aware of critical labs.

## 2017-12-01 NOTE — Progress Notes (Signed)
ANTICOAGULATION CONSULT NOTE - Initial Consult  Pharmacy Consult for heparin Indication: atrial fibrillation  No Known Allergies  Patient Measurements: Height: 5\' 11"  (180.3 cm) Weight: 222 lb 5 oz (100.8 kg) IBW/kg (Calculated) : 75.3 Heparin Dosing Weight: 96.1 kg  Vital Signs: Temp: 97.2 F (36.2 C) (06/12 1838) Temp Source: Oral (06/12 1838) BP: 102/87 (06/13 0030) Pulse Rate: 100 (06/13 0000)  Labs: Recent Labs    11/30/17 1745 11/30/17 1751  HGB 12.9* 13.6  HCT 40.6 40.0  PLT 251  --   APTT 33  --   LABPROT 14.6  --   INR 1.15  --   CREATININE 1.62* 1.50*    Estimated Creatinine Clearance: 41.2 mL/min (A) (by C-G formula based on SCr of 1.5 mg/dL (H)).   Medical History: Past Medical History:  Diagnosis Date  . Dermatitis   . Diabetes mellitus   . Gout   . Hyperlipemia   . Obesity   . Peripheral neuropathy   . Pulmonary nodule     Medications:  See medication history, was not on anticoagulation prior to admission  Assessment: 84 yo man to start heparin for afib.   Goal of Therapy:  Heparin level 0.3-0.7 units/ml Monitor platelets by anticoagulation protocol: Yes   Plan:  Heparin 4000 unit bolus and drip at 1150 units/hr Check heparin level 6-8 hours after start Daily HL and CBC while on heparin Monitor for bleeding complications  Gio Janoski Poteet 12/01/2017,12:40 AM

## 2017-12-02 LAB — BASIC METABOLIC PANEL
ANION GAP: 5 (ref 5–15)
BUN: 27 mg/dL — ABNORMAL HIGH (ref 6–20)
CALCIUM: 8.6 mg/dL — AB (ref 8.9–10.3)
CO2: 27 mmol/L (ref 22–32)
Chloride: 105 mmol/L (ref 101–111)
Creatinine, Ser: 1.34 mg/dL — ABNORMAL HIGH (ref 0.61–1.24)
GFR, EST AFRICAN AMERICAN: 53 mL/min — AB (ref >60)
GFR, EST NON AFRICAN AMERICAN: 46 mL/min — AB (ref >60)
Glucose, Bld: 93 mg/dL (ref 65–99)
POTASSIUM: 4.5 mmol/L (ref 3.5–5.1)
Sodium: 137 mmol/L (ref 135–145)

## 2017-12-02 LAB — HEPARIN LEVEL (UNFRACTIONATED): HEPARIN UNFRACTIONATED: 0.56 [IU]/mL (ref 0.30–0.70)

## 2017-12-02 LAB — MAGNESIUM: Magnesium: 2 mg/dL (ref 1.7–2.4)

## 2017-12-02 LAB — CBC
HEMATOCRIT: 37.4 % — AB (ref 39.0–52.0)
Hemoglobin: 11.8 g/dL — ABNORMAL LOW (ref 13.0–17.0)
MCH: 31.6 pg (ref 26.0–34.0)
MCHC: 31.6 g/dL (ref 30.0–36.0)
MCV: 100 fL (ref 78.0–100.0)
PLATELETS: 235 10*3/uL (ref 150–400)
RBC: 3.74 MIL/uL — AB (ref 4.22–5.81)
RDW: 14.2 % (ref 11.5–15.5)
WBC: 8.1 10*3/uL (ref 4.0–10.5)

## 2017-12-02 LAB — URINE CULTURE: CULTURE: NO GROWTH

## 2017-12-02 LAB — GLUCOSE, CAPILLARY
GLUCOSE-CAPILLARY: 89 mg/dL (ref 65–99)
Glucose-Capillary: 86 mg/dL (ref 65–99)

## 2017-12-02 MED ORDER — FUROSEMIDE 10 MG/ML IJ SOLN
40.0000 mg | Freq: Once | INTRAMUSCULAR | Status: AC
  Administered 2017-12-02: 40 mg via INTRAVENOUS
  Filled 2017-12-02: qty 4

## 2017-12-02 MED ORDER — DILTIAZEM HCL 60 MG PO TABS
60.0000 mg | ORAL_TABLET | Freq: Three times a day (TID) | ORAL | Status: DC
  Administered 2017-12-02 – 2017-12-03 (×4): 60 mg via ORAL
  Filled 2017-12-02 (×4): qty 1

## 2017-12-02 MED ORDER — APIXABAN 5 MG PO TABS
5.0000 mg | ORAL_TABLET | Freq: Two times a day (BID) | ORAL | Status: DC
  Administered 2017-12-02 – 2017-12-03 (×3): 5 mg via ORAL
  Filled 2017-12-02 (×3): qty 1

## 2017-12-02 NOTE — Progress Notes (Signed)
PROGRESS NOTE    Albert [name]  ZOX:[id number] DOB: [birth year] DOA: 11/30/2017 PCP: Martha Clan, MD    Brief Narrative:   Albert [name] is a 84 y.o. male with medical history significant of diet-controlled diabetes, hyperlipidemia, gout, obesity, CKD-3, currently not taking medications except for Aleve, who presented with confusion, poor balance, chest pain, palpitation and fall.  Per patient's son and wife, patient has been confused in the past 2 weeks.  Patient had broken tooth removed 2 weeks ago, since then he has been mildly confused.  She also has poor balance. He fell this AM, injured his right side of head on the door.  No LOC.  Patient does not have unilateral weakness or numbness in extremities.  No facial droop.  He seems to have mild slurred speech per his son.  He has chronic hearing loss, wearing hearing aid.  No vision loss.  Patient also has been having intermittent mild chest pain for about 1 week.  It is located in substernal area, dull, nonradiating.  It is exertional, happens when he moves around intermittently.  Currently patient does not have chest pain or shortness of breath, cough.  No fever or chills.  Patient does not have nausea, vomiting, diarrhea, abdominal pain, symptoms of UTI.  Patient was found to have new onset A. fib with RVR, with heart rate up to 130s, Cardizem drip is started in ED.     Assessment & Plan:   Principal Problem:   Acute metabolic encephalopathy Active Problems:   Atrial fibrillation (HCC)   Elevated troponin   Fall   Type II diabetes mellitus with renal manifestations (HCC)   HLD (hyperlipidemia)   CKD (chronic kidney disease), stage III (HCC)   Chest pain  #1 acute metabolic encephalopathy/fall Unclear etiology.  Likely secondary to underlying dementia.  Patient on admission was noted to have some mild slurred speech poor balance however no other focal neurological findings.  CT head negative for any acute intracranial abnormalities  however did show some small lacunar infarcts.  MRI brain was negative for any acute abnormalities, old right frontal lobe infarct, and generalized brain atrophy without acute abnormality.  MRI consistent with dementia. Chest x-ray unremarkable.  No signs of any acute infection.  TSH within normal limits at 3.931.  Nonreactive.  Vitamin B12 levels at 595.  Ammonia level was 34.  Improving clinically per family.  PT/OT.  Supportive care.  Follow.  2.  New onset atrial fibrillation CHA2DS2VASC AT LEAST 4 Patient noted to be new onset A. fib on admission with heart rates in the 130s and started on a Cardizem drip in the ED.  Cardiac enzymes mildly elevated going from 0.15 to 0.14-0.13 to 0.11.  BNP was 632.6 however patient clinically dry.  TSH within normal limits.  2D echo with moderate left ear, severe left atrial enlargement, moderate pulmonary hypertension, normal systolic function with no wall motion abnormalities.  Cardizem drip was transitioned to oral Cardizem on admission and dose uptitrated to 60 mg every 6 hours for better rate control.  Patient on IV heparin for anticoagulation and has been transitioned per cardiology to Eliquis.  Cardiology following.  3.  Chest pain and elevated troponin Troponin seems to be slowly trending down.  EKG with no significant ischemic changes.  Fasting lipid panel with a LDL of 75.  A1c 5.2.  2D echo with EF of 55 to 60% with no wall motion abnormalities.  Left atrium severely dilated, moderate tricuspid valvular regurgitation, severely calcified  mitral valvular, moderate aortic valvular stenosis.  Patient currently chest pain-free.  Patient has been seen in consultation by cardiology who feel patient is unlikely to be a SAVR or TAVR candidate in the future with dementia.  Cardiology recommending 40 mg IV Lasix x1.  Follow.  Per cardiology.   4.  Chronic kidney disease stage III (baseline creatinine 1.5-1.7) Stable.  5.  Diabetes mellitus type 2 Hemoglobin A1c was  5.2.  Per family patient has been taken off oral hypoglycemic agents.  Discontinue daily CBGs.  Follow.  6.  Hyperlipidemia LDL at 75.  Continue statin.      DVT prophylaxis: Heparin Code Status: Partial Family Communication: Updated patient and wife at bedside. Disposition Plan: To be determined.   Consultants:   Cardiology: Dr. Deforest Hoyles 12/01/2017  Procedures:   2D echo 12/01/2017  X-ray 12/01/2017  ChestT head 11/30/2017  MRI brain 11/30/2017  Antimicrobials:   None   Subjective: Patient sleeping.  Opens eyes to verbal stimuli.  Denies any chest pain.  No shortness of breath.  Feeling better.  Wife at bedside states patient improving since admission.    Objective: Vitals:   12/01/17 2112 12/02/17 0018 12/02/17 0742 12/02/17 1246  BP: 98/76 99/75 (!) 91/58 97/81  Pulse: (!) 114 99 94 (!) 143  Resp: 18 19 (!) 21 18  Temp: (!) 97.5 F (36.4 C) 97.7 F (36.5 C) 97.6 F (36.4 C) 97.6 F (36.4 C)  TempSrc: Oral Oral Oral Oral  SpO2: 99% 97% 96% (!) 86%  Weight:      Height:        Intake/Output Summary (Last 24 hours) at 12/02/2017 1300 Last data filed at 12/02/2017 0449 Gross per 24 hour  Intake 1160.86 ml  Output 275 ml  Net 885.86 ml   Filed Weights   11/30/17 1735 12/01/17 1330  Weight: 100.8 kg (222 lb 5 oz) 99.7 kg (219 lb 12.8 oz)    Examination:  General exam: NAD Respiratory system: Bibasilar crackles.  No wheezes, no rhonchi.  Respiratory effort normal. Cardiovascular system: Irregularly irregular.3/6 harsh SEM radiating to carotids  No lower extremity edema.  No JVD.  No rubs gallops or murmurs.  Gastrointestinal system: Abdomen is nontender, nondistended, soft, positive bowel sounds.  No rebound.  No guarding.  Central nervous system: Alert and oriented. No focal neurological deficits. Extremities: Symmetric 5 x 5 power. Skin: No rashes, lesions or ulcers Psychiatry: Judgement and insight appear normal. Mood & affect appropriate.     Data  Reviewed: I have personally reviewed following labs and imaging studies  CBC: Recent Labs  Lab 11/30/17 1745 11/30/17 1751 12/01/17 1020 12/02/17 0418  WBC 9.0  --  7.5 8.1  NEUTROABS 6.7  --  5.2  --   HGB 12.9* 13.6 12.2* 11.8*  HCT 40.6 40.0 39.1 37.4*  MCV 99.3  --  101.3* 100.0  PLT 251  --  216 235   Basic Metabolic Panel: Recent Labs  Lab 11/30/17 1745 11/30/17 1751 12/01/17 1020 12/02/17 0418  NA 138 137 139 137  K 4.9 4.8 4.7 4.5  CL 103 101 106 105  CO2 26  --  24 27  GLUCOSE 112* 108* 85 93  BUN 29* 31* 25* 27*  CREATININE 1.62* 1.50* 1.40* 1.34*  CALCIUM 9.4  --  8.8* 8.6*  MG  --   --  2.0 2.0   GFR: Estimated Creatinine Clearance: 45.9 mL/min (A) (by C-G formula based on SCr of 1.34 mg/dL (H)). Liver Function Tests:  Recent Labs  Lab 11/30/17 1745  AST 26  ALT 22  ALKPHOS 60  BILITOT 0.9  PROT 6.7  ALBUMIN 3.6   No results for input(s): LIPASE, AMYLASE in the last 168 hours. Recent Labs  Lab 11/30/17 2335  AMMONIA 34   Coagulation Profile: Recent Labs  Lab 11/30/17 1745  INR 1.15   Cardiac Enzymes: Recent Labs  Lab 11/30/17 2335 12/01/17 0356 12/01/17 1020  TROPONINI 0.14* 0.13* 0.11*   BNP (last 3 results) No results for input(s): PROBNP in the last 8760 hours. HbA1C: Recent Labs    12/01/17 0356  HGBA1C 5.2   CBG: Recent Labs  Lab 12/01/17 0636 12/01/17 0810 12/01/17 2118 12/02/17 0024 12/02/17 0340  GLUCAP 77 79 138* 86 89   Lipid Profile: Recent Labs    12/01/17 0356  CHOL 127  HDL 46  LDLCALC 75  TRIG 32  CHOLHDL 2.8   Thyroid Function Tests: Recent Labs    12/01/17 0356  TSH 3.931   Anemia Panel: Recent Labs    12/01/17 0356  VITAMINB12 595   Sepsis Labs: No results for input(s): PROCALCITON, LATICACIDVEN in the last 168 hours.  Recent Results (from the past 240 hour(s))  Urine Culture     Status: None   Collection Time: 12/01/17  5:27 AM  Result Value Ref Range Status   Specimen  Description URINE, CLEAN CATCH  Final   Special Requests NONE  Final   Culture   Final    NO GROWTH Performed at [facility], [address] [place], [place] [address]    Report Status 12/02/2017 FINAL  Final  MRSA PCR Screening     Status: None   Collection Time: 12/01/17  1:18 PM  Result Value Ref Range Status   MRSA by PCR NEGATIVE NEGATIVE Final    Comment:        The GeneXpert MRSA Assay (FDA approved for NASAL specimens only), is one component of a comprehensive MRSA colonization surveillance program. It is not intended to diagnose MRSA infection nor to guide or monitor treatment for MRSA infections. Performed at [facility], [address] [place], [place] [address]          Radiology Studies: Ct Head Wo Contrast  Result Date: 11/30/2017 CLINICAL DATA:  Slurred speech and increased confusion with loss of balance for 2-3 weeks. EXAM: CT HEAD WITHOUT CONTRAST TECHNIQUE: Contiguous axial images were obtained from the base of the skull through the vertex without intravenous contrast. COMPARISON:  None. FINDINGS: BRAIN: There is moderate sulcal and ventricular prominence consistent with superficial and central atrophy. Left caudate head lacunar infarct is noted, age indeterminate but likely chronic. No intraparenchymal hemorrhage, mass effect nor midline shift. Mild-to-moderate periventricular and subcortical white matter hypodensities consistent with chronic small vessel ischemic disease are identified. No acute large vascular territory infarcts. No abnormal extra-axial fluid collections. Basal cisterns are not effaced and midline. VASCULAR: Moderate calcific atherosclerosis of the carotid siphons and both middle cerebral arteries. SKULL: No skull fracture. No significant scalp soft tissue swelling. SINUSES/ORBITS: The mastoid air-cells are clear. The included paranasal sinuses are well-aerated.The included ocular globes and orbital contents are non-suspicious.  OTHER: Right posterior parietal scalp contusion is noted. IMPRESSION: 1. Right parietal scalp contusion without underlying skull fracture. 2. Moderate superficial and central atrophy with small vessel ischemia. Small left caudate head lacunar infarct. 3. Dense atherosclerotic calcifications at the skull base as above. 4. No acute intracranial abnormality identified on CT. Electronically Signed  By: Tollie Eth M.D.   On: 11/30/2017 18:26   Mr Brain Wo Contrast (neuro Protocol)  Result Date: 11/30/2017 CLINICAL DATA:  Slurred speech and altered mental status EXAM: MRI HEAD WITHOUT CONTRAST TECHNIQUE: Multiplanar, multiecho pulse sequences of the brain and surrounding structures were obtained without intravenous contrast. COMPARISON:  Head CT 11/30/2017 FINDINGS: The study is degraded by motion, despite efforts to reduce this artifact, including utilization of motion-resistant MR sequences. The findings of the study are interpreted in the context of reduced sensitivity/specificity. BRAIN: There is no acute infarct, acute hemorrhage or mass effect. The midline structures are normal. There is an old right frontal lobe infarct. Minimal white matter hyperintensity, nonspecific and commonly seen in asymptomatic patients of this age. Generalized atrophy without lobar predilection. Susceptibility-sensitive sequences show no chronic microhemorrhage or superficial siderosis. VASCULAR: Major intracranial arterial and venous sinus flow voids are preserved. SKULL AND UPPER CERVICAL SPINE: The visualized skull base, calvarium, upper cervical spine and extracranial soft tissues are normal. SINUSES/ORBITS: No fluid levels or advanced mucosal thickening. No mastoid or middle ear effusion. The orbits are normal. IMPRESSION: Old right frontal lobe infarct and generalized brain atrophy without acute abnormality. Electronically Signed   By: Deatra Robinson M.D.   On: 11/30/2017 22:48   Dg Chest Port 1 View  Result Date:  12/01/2017 CLINICAL DATA:  Increasing fatigue over 2 weeks EXAM: PORTABLE CHEST 1 VIEW COMPARISON:  02/26/2009 FINDINGS: Cardiac shadow is mildly enlarged but stable. Aortic calcifications are seen. Vascular congestion and mild edema is identified consistent with CHF. No sizable effusion is noted. No bony abnormality is seen. IMPRESSION: Changes consistent with CHF. Electronically Signed   By: Alcide Clever M.D.   On: 12/01/2017 09:53        Scheduled Meds: . apixaban  5 mg Oral BID  . aspirin EC  325 mg Oral Daily  . atorvastatin  80 mg Oral q1800  . diltiazem  60 mg Oral Q8H   Continuous Infusions:    LOS: 2 days    Time spent: 35 minutes    Ramiro Harvest, MD [facility] Pager [phone]  If 7PM-7AM, please contact night-coverage [email or web address] Password [email or web address] 12/02/2017, 1:00 PM

## 2017-12-02 NOTE — Clinical Social Work Note (Signed)
Clinical Social Work Assessment  Patient Details  Name: Albert [name] MRN: [id number] Date of Birth: January 11, 1928  Date of referral:  12/02/17               Reason for consult:  Facility Placement, Discharge Planning                Permission sought to share information with:  Family Supports Permission granted to share information::  Yes, Verbal Permission Granted  Name::     Adnan Vanvoorhis   Agency::     Relationship::  spouse  Contact Information:  [phone]  Housing/Transportation Living arrangements for the past 2 months:  [place] of Information:  Patient, Spouse Patient Interpreter Needed:  None Criminal Activity/Legal Involvement Pertinent to Current Situation/Hospitalization:  No - Comment as needed Significant Relationships:  Adult Children, Spouse Lives with:  Spouse Do you feel safe going back to the place where you live?  Yes Need for family participation in patient care:  Yes (Comment)  Care giving concerns: Patient from home with spouse. PT recommending SNF.   Social Worker assessment / plan: CSW met with patient and wife, Bonnita Nasuti, at bedside. Patient alert, though not fully oriented to situation and was forgetful. Patient's daughter joined at end of assessment. CSW introduced self and role and discussed disposition planning and PT's recommendation for SNF. Patient stated he wants to get up and move around today; CSW informed RN.  CSW discussed considerations and process for SNF- if family chooses SNF, referrals will be sent out and chosen facility will have to obtain [facility] authorization. CSW informed wife that [facility] authorization can take a few days, so if patient will be medically ready over the weekend, it would be best to start referral process now. Wife declined to send out referrals now, stating she wants to talk to her children and will follow up with CSW. Wife also hopeful patient can go home if physically able and would be agreeable to home health  services. CSW also offered option to private pay for SNF if no authorization given when patient is medically ready, or go to a facility that will take LOG.  CSW will follow for disposition planning.  Employment status:  Retired Forensic scientist:  Managed Medicare([employer]) PT Recommendations:  [place] / Referral to community resources:  [place]  Patient/Family's Response to care: Patient and family appreciative of care.  Patient/Family's Understanding of and Emotional Response to Diagnosis, Current Treatment, and Prognosis: Family with good understanding of patient's condition and hopeful for return home.  Emotional Assessment Appearance:  Appears stated age Attitude/Demeanor/Rapport:  Engaged Affect (typically observed):  Accepting, Calm, Pleasant Orientation:  Oriented to Self, Oriented to  Time, Oriented to Situation Alcohol / Substance use:  Not Applicable Psych involvement (Current and /or in the community):     Discharge Needs  Concerns to be addressed:  Discharge Planning Concerns, Care Coordination Readmission within the last 30 days:  No Current discharge risk:  Physical Impairment, Cognitively Impaired Barriers to Discharge:  Continued Medical Work up, [place], LCSW 12/02/2017, 10:26 AM

## 2017-12-02 NOTE — Care Management Important Message (Signed)
Important Message  Patient Details  Name: Albert [name] MRN: [id number] Date of Birth: 05/20/1928   Medicare Important Message Given:  Yes    Makel Mcmann P Iris Hairston 12/02/2017, 3:44 PM

## 2017-12-02 NOTE — [place] FL2 (Signed)
[place] MEDICAID FL2 LEVEL OF CARE SCREENING TOOL     IDENTIFICATION  Patient Name: Albert [name] Birthdate: 1928/04/01 Sex: male Admission Date (Current Location): 11/30/2017  [facility] and [place] Number:  Producer, television/film/video and Address:  [facility] [place], [address], [place], [place] [address]      Provider Number: [id number]  Attending Physician Name and Address:  Rodolph Bong, MD  Relative Name and Phone Number:  Chrisangel Eskenazi, spouse, [phone]    Current Level of Care: Hospital Recommended Level of Care: Skilled Nursing Facility Prior Approval Number:    Date Approved/Denied:   PASRR Number: [id number]  Discharge Plan: SNF    Current Diagnoses: Patient Active Problem List   Diagnosis Date Noted  . Acute metabolic encephalopathy 11/30/2017  . Atrial fibrillation (HCC) 11/30/2017  . Elevated troponin 11/30/2017  . Fall 11/30/2017  . Type II diabetes mellitus with renal manifestations (HCC) 11/30/2017  . HLD (hyperlipidemia) 11/30/2017  . CKD (chronic kidney disease), stage III (HCC) 11/30/2017  . Chest pain 11/30/2017    Orientation RESPIRATION BLADDER Height & Weight     Self, Time  Normal Incontinent Weight: 219 lb 12.8 oz (99.7 kg) Height:  5\' 11"  (180.3 cm)  BEHAVIORAL SYMPTOMS/MOOD NEUROLOGICAL BOWEL NUTRITION STATUS      Continent Diet(please see DC summary)  AMBULATORY STATUS COMMUNICATION OF NEEDS Skin   Extensive Assist Verbally Normal                       Personal Care Assistance Level of Assistance  Bathing, Feeding, Dressing Bathing Assistance: Maximum assistance Feeding assistance: Limited assistance Dressing Assistance: Maximum assistance     Functional Limitations Info  Sight, Hearing, Speech Sight Info: Adequate Hearing Info: Adequate Speech Info: Adequate    SPECIAL CARE FACTORS FREQUENCY  PT (By licensed PT), OT (By licensed OT)     PT Frequency: 5x/week OT Frequency: 5x/week             Contractures Contractures Info: Not present    Additional Factors Info  Code Status, Allergies, Isolation Precautions Code Status Info: Partial Allergies Info: No Known Allergies     Isolation Precautions Info: MRSA     Current Medications (12/02/2017):  This is the current hospital active medication list Current Facility-Administered Medications  Medication Dose Route Frequency Provider Last Rate Last Dose  . acetaminophen (TYLENOL) tablet 650 mg  650 mg Oral Q4H PRN Lorretta Harp, MD      . aspirin EC tablet 325 mg  325 mg Oral Daily Lorretta Harp, MD   325 mg at 12/02/17 8469  . atorvastatin (LIPITOR) tablet 80 mg  80 mg Oral q1800 Lorretta Harp, MD   80 mg at 12/01/17 1813  . diltiazem (CARDIZEM) tablet 60 mg  60 mg Oral Q6H Rodolph Bong, MD   60 mg at 12/02/17 6295  . heparin ADULT infusion 100 units/mL (25000 units/236mL sodium chloride 0.45%)  1,150 Units/hr Intravenous Continuous Lorretta Harp, MD 11.5 mL/hr at 12/02/17 0449 1,150 Units/hr at 12/02/17 0449  . hydrALAZINE (APRESOLINE) injection 5 mg  5 mg Intravenous Q2H PRN Lorretta Harp, MD      . morphine 4 MG/ML injection 1 mg  1 mg Intravenous Q4H PRN Lorretta Harp, MD      . nitroGLYCERIN (NITROSTAT) SL tablet 0.4 mg  0.4 mg Sublingual Q5 min PRN Lorretta Harp, MD      . ondansetron [facility]) injection 4 mg  4 mg Intravenous  Q6H PRN Lorretta HarpNiu, Xilin, MD      . zolpidem (AMBIEN) tablet 5 mg  5 mg Oral QHS PRN Lorretta HarpNiu, Xilin, MD         Discharge Medications: Please see discharge summary for a list of discharge medications.  Relevant Imaging Results:  Relevant Lab Results:   Additional Information SSN: [id number]  Abigail ButtsSusan Avanelle Pixley, LCSW

## 2017-12-02 NOTE — Progress Notes (Signed)
Progress Note  Patient Name: Albert [name] Date of Encounter: 12/02/2017  Primary Cardiologist: Chrystie NoseKenneth C Hilty, MD - New  Subjective   Pt is alert and mostly oriented to self and place, but thoughts are not very clear. He asks if the hallway is the street. He says that he feels better and denies chest pain or shortness of breath, although he appears to be breathing harder with talking. Wife reports that he is much better since admission.  Inpatient Medications    Scheduled Meds: . aspirin EC  325 mg Oral Daily  . atorvastatin  80 mg Oral q1800  . diltiazem  60 mg Oral Q6H   Continuous Infusions: . heparin 1,150 Units/hr (12/02/17 0449)   PRN Meds: acetaminophen, hydrALAZINE, morphine injection, nitroGLYCERIN, ondansetron (ZOFRAN) IV, zolpidem   Vital Signs    Vitals:   12/01/17 1511 12/01/17 2112 12/02/17 0018 12/02/17 0742  BP: 114/89 98/76 99/75  (!) 91/58  Pulse:  (!) 114 99 94  Resp:  18 19 (!) 21  Temp:  (!) 97.5 F (36.4 C) 97.7 F (36.5 C) 97.6 F (36.4 C)  TempSrc:  Oral Oral Oral  SpO2:  99% 97% 96%  Weight:      Height:        Intake/Output Summary (Last 24 hours) at 12/02/2017 1045 Last data filed at 12/02/2017 0449 Gross per 24 hour  Intake 1160.86 ml  Output 275 ml  Net 885.86 ml   Filed Weights   11/30/17 1735 12/01/17 1330  Weight: 222 lb 5 oz (100.8 kg) 219 lb 12.8 oz (99.7 kg)    Telemetry    Atrial fibrillation in the 90's - Personally Reviewed  ECG    No new tracings - Personally Reviewed  Physical Exam   GEN: Elderly male, No acute distress.   Neck: No JVD Cardiac: Irregularly irregular rhythm, 3/6 harsh systolic murmur in LUSB/RUSB radiates to the carotids Respiratory: Clear to auscultation bilaterally. GI: Soft, nontender, non-distended  MS: Ankle edema Neuro:  Alert, oriented to person and family, variable orientation to place and situation Psych: Normal affect   Labs    Chemistry Recent Labs  Lab 11/30/17 1745  11/30/17 1751 12/01/17 1020 12/02/17 0418  NA 138 137 139 137  K 4.9 4.8 4.7 4.5  CL 103 101 106 105  CO2 26  --  24 27  GLUCOSE 112* 108* 85 93  BUN 29* 31* 25* 27*  CREATININE 1.62* 1.50* 1.40* 1.34*  CALCIUM 9.4  --  8.8* 8.6*  PROT 6.7  --   --   --   ALBUMIN 3.6  --   --   --   AST 26  --   --   --   ALT 22  --   --   --   ALKPHOS 60  --   --   --   BILITOT 0.9  --   --   --   GFRNONAA 36*  --  43* 46*  GFRAA 42*  --  50* 53*  ANIONGAP 9  --  9 5     Hematology Recent Labs  Lab 11/30/17 1745 11/30/17 1751 12/01/17 1020 12/02/17 0418  WBC 9.0  --  7.5 8.1  RBC 4.09*  --  3.86* 3.74*  HGB 12.9* 13.6 12.2* 11.8*  HCT 40.6 40.0 39.1 37.4*  MCV 99.3  --  101.3* 100.0  MCH 31.5  --  31.6 31.6  MCHC 31.8  --  31.2 31.6  RDW 14.1  --  14.3 14.2  PLT 251  --  216 235    Cardiac Enzymes Recent Labs  Lab 11/30/17 2335 12/01/17 0356 12/01/17 1020  TROPONINI 0.14* 0.13* 0.11*    Recent Labs  Lab 11/30/17 1749  TROPIPOC 0.15*     BNP Recent Labs  Lab 11/30/17 2335  BNP 632.6*     DDimer No results for input(s): DDIMER in the last 168 hours.   Radiology    Ct Head Wo Contrast  Result Date: 11/30/2017 CLINICAL DATA:  Slurred speech and increased confusion with loss of balance for 2-3 weeks. EXAM: CT HEAD WITHOUT CONTRAST TECHNIQUE: Contiguous axial images were obtained from the base of the skull through the vertex without intravenous contrast. COMPARISON:  None. FINDINGS: BRAIN: There is moderate sulcal and ventricular prominence consistent with superficial and central atrophy. Left caudate head lacunar infarct is noted, age indeterminate but likely chronic. No intraparenchymal hemorrhage, mass effect nor midline shift. Mild-to-moderate periventricular and subcortical white matter hypodensities consistent with chronic small vessel ischemic disease are identified. No acute large vascular territory infarcts. No abnormal extra-axial fluid collections. Basal  cisterns are not effaced and midline. VASCULAR: Moderate calcific atherosclerosis of the carotid siphons and both middle cerebral arteries. SKULL: No skull fracture. No significant scalp soft tissue swelling. SINUSES/ORBITS: The mastoid air-cells are clear. The included paranasal sinuses are well-aerated.The included ocular globes and orbital contents are non-suspicious. OTHER: Right posterior parietal scalp contusion is noted. IMPRESSION: 1. Right parietal scalp contusion without underlying skull fracture. 2. Moderate superficial and central atrophy with small vessel ischemia. Small left caudate head lacunar infarct. 3. Dense atherosclerotic calcifications at the skull base as above. 4. No acute intracranial abnormality identified on CT. Electronically Signed   By: Tollie Eth M.D.   On: 11/30/2017 18:26   Mr Brain Wo Contrast (neuro Protocol)  Result Date: 11/30/2017 CLINICAL DATA:  Slurred speech and altered mental status EXAM: MRI HEAD WITHOUT CONTRAST TECHNIQUE: Multiplanar, multiecho pulse sequences of the brain and surrounding structures were obtained without intravenous contrast. COMPARISON:  Head CT 11/30/2017 FINDINGS: The study is degraded by motion, despite efforts to reduce this artifact, including utilization of motion-resistant MR sequences. The findings of the study are interpreted in the context of reduced sensitivity/specificity. BRAIN: There is no acute infarct, acute hemorrhage or mass effect. The midline structures are normal. There is an old right frontal lobe infarct. Minimal white matter hyperintensity, nonspecific and commonly seen in asymptomatic patients of this age. Generalized atrophy without lobar predilection. Susceptibility-sensitive sequences show no chronic microhemorrhage or superficial siderosis. VASCULAR: Major intracranial arterial and venous sinus flow voids are preserved. SKULL AND UPPER CERVICAL SPINE: The visualized skull base, calvarium, upper cervical spine and  extracranial soft tissues are normal. SINUSES/ORBITS: No fluid levels or advanced mucosal thickening. No mastoid or middle ear effusion. The orbits are normal. IMPRESSION: Old right frontal lobe infarct and generalized brain atrophy without acute abnormality. Electronically Signed   By: Deatra Robinson M.D.   On: 11/30/2017 22:48   Dg Chest Port 1 View  Result Date: 12/01/2017 CLINICAL DATA:  Increasing fatigue over 2 weeks EXAM: PORTABLE CHEST 1 VIEW COMPARISON:  02/26/2009 FINDINGS: Cardiac shadow is mildly enlarged but stable. Aortic calcifications are seen. Vascular congestion and mild edema is identified consistent with CHF. No sizable effusion is noted. No bony abnormality is seen. IMPRESSION: Changes consistent with CHF. Electronically Signed   By: Alcide Clever M.D.   On: 12/01/2017 09:53    Cardiac Studies   Echocardiogram 12/01/17 Study  Conclusions - Left ventricle: The cavity size was normal. Wall thickness was increased in a pattern of moderate LVH. There was moderate concentric hypertrophy. Systolic function was normal. The estimated ejection fraction was in the range of 55% to 60%. Wall  motion was normal; there were no regional wall motion  abnormalities. - Aortic valve: Trileaflet; mildly thickened, severely calcified leaflets. Valve mobility was moderately restricted. There was moderate stenosis. Valve area (VTI): 1.06 cm^2. Valve area (Vmax): 1.06 cm^2. Valve area (Vmean): 0.86 cm^2. - Aortic root: The aortic root was mildly dilated. - Mitral valve: Severely calcified annulus. Severely calcified leaflets . Leaflet separation was reduced. Mobility was severely restricted. There was mild regurgitation. - Left atrium: The atrium was severely dilated. - Tricuspid valve: There was moderate regurgitation. - Pulmonary arteries: Systolic pressure was moderately increased. PA peak pressure: 62 mm Hg (S).   Patient Profile     84 y.o. male with a past history of mild dementia, gout,  hyperlipidemia, and a pulmonary nodule. Presented for increasing somnolence and fatigue X 2 weeks. Found to be in atrial fibrillation.   Assessment & Plan    New onset atrial fibrillation  -unknown duration. Pt has been having functional decline over the previous 2 weeks.  -Rate controlled on diltiazem IV transitioned to oral dose yesterday 30 mg q 8h. Increased to 60 mg Q 6h today for better rate control -Would like to add BB but BP is soft.  -CHA2DS2/VAS Stroke Risk Score is 2 for age. He is currently on heparin drip for stroke risk reduction. Will transition to DOAC. SCr 1.34, CrCl 53.5 (SCr has been over 1.5 at times). Should be able to use Eliquis 5 mg bid with close outpatient follow up. Will consult pharmacy for dosing input. -Echo showed normal EF with  Moderate LVH and severely dilated LA.  -Will pursue a rate control strategy given his advanced age and improvement in his symptoms with rate control.   Diastolic HF -Normal EF on echo. Elevated PA peak pressure 62 mmHg -BNP was 632 on admission. Troponin mildly elevated in flat pattern likely due to demand ischemia in setting of afib with RVR and HF.  -Lungs are clear and neck veins are flat but pt appears to be a little short of breath with exertion and has mild ankle edema. Will give one dose of IV lasix 40 mg.     Altered mental status -CT and MRI negative for acute pathology. MRI revealed old right frontal lobe infarct. Prior to admission was noted to have declining cognitive function (? Early dementia). -Pt is improved, per family, since admission.  Moderated aortic stenosis -With mildly dilated aortic root. Pt not likely a surgical candidate for the future.    Mild mitral regurgitation  CKD -SCr was noted to be elevated at 1.7 in 2010.  -SCr 1.62 on admission >1.5 >1.4 >1.34    For questions or updates, please contact [facility] Please consult [email or web address] for contact info under Cardiology/STEMI.       Signed, Berton Bon, NP  12/02/2017, 10:45 AM

## 2017-12-02 NOTE — Progress Notes (Signed)
ANTICOAGULATION CONSULT NOTE - Follow-Up Consult  Pharmacy Consult for heparin switch to Eliquis  Indication: atrial fibrillation, non-valvular  No Known Allergies  Patient Measurements: Height: 5\' 11"  (180.3 cm) Weight: 219 lb 12.8 oz (99.7 kg) IBW/kg (Calculated) : 75.3 Heparin Dosing Weight: 96.1 kg  Vital Signs: Temp: 97.6 F (36.4 C) (06/14 1246) Temp Source: Oral (06/14 1246) BP: 109/78 (06/14 1452) Pulse Rate: 143 (06/14 1246)  Labs: Recent Labs    11/30/17 1745 11/30/17 1751 11/30/17 2335 12/01/17 0356 12/01/17 1020 12/01/17 1700 12/02/17 0418  HGB 12.9* 13.6  --   --  12.2*  --  11.8*  HCT 40.6 40.0  --   --  39.1  --  37.4*  PLT 251  --   --   --  216  --  235  APTT 33  --   --   --   --   --   --   LABPROT 14.6  --   --   --   --   --   --   INR 1.15  --   --   --   --   --   --   HEPARINUNFRC  --   --   --   --  0.47 0.61 0.56  CREATININE 1.62* 1.50*  --   --  1.40*  --  1.34*  TROPONINI  --   --  0.14* 0.13* 0.11*  --   --     Estimated Creatinine Clearance: 45.9 mL/min (A) (by C-G formula based on SCr of 1.34 mg/dL (H)).   Medical History: Past Medical History:  Diagnosis Date  . Arthritis    "pain in my joints" (12/01/2017)  . Atrial fibrillation (HCC)   . Dermatitis   . Gout X1  . History of kidney stones   . Hyperlipemia   . Obesity   . Peripheral neuropathy   . Pulmonary nodule   . Type 2 diabetes, diet controlled (HCC)     Medications:  See medication history, was not on anticoagulation prior to admission  Assessment: [age] yoM with new onset AFib with RVR. CHADSVASc = 3, CT head negative. Therapeutic heparin level this AM on IV heparin 1150 units/hr.  Switched to APIXABAN 5 mg po BID this AM.  CKD stage3 , SCR 1.50>1.34,  age 84 y.o wt 99.7kg Hgb 13.6>12.2>11.8, Pltc wnl Cardiologist noted 6/14 that (SCr has been over 1.5 at times). Should be able to use Eliquis 5 mg bid with close outpatient follow up. Will consult pharmacy for  dosing input.-  agree to start with 5 mg BID    Plan:  Heparin discontinued and switched to Eliquis for nonvalvular afib Apixaban 5 mg po bid  Noah Delaineuth Brinlyn Cena, RPh Clinical Pharmacist Main pharmacy [phone]  12/02/2017 6:01 PM

## 2017-12-02 NOTE — Evaluation (Signed)
Occupational Therapy Evaluation Patient Details Name: Albert [name] MRN: [id number] DOB: 04/12/1928 Today's Date: 12/02/2017    History of Present Illness Patient is a 84 y/o male presenting with confusion, fall, poor balance, and chest pain. Admitted with acute metabolic encephalopathy with unclear etiology. Of note, patient with new onset A Fib with RVR. CT head is negative for acute intracranial abnormalities, but showed a small lacunar infarcts. Patient with a PMH significant for diet-controlled diabetes, hyperlipidemia, gout, obesity, CKD-3, currently not taking medications except for Aleve.Patient is a 84 y/o male presenting with confusion, fall, poor balance, and chest pain. Admitted with acute metabolic encephalopathy with unclear etiology. Of note, patient with new onset A Fib with RVR. CT head is negative for acute intracranial abnormalities, but showed a small lacunar infarcts. Patient with a PMH significant for diet-controlled diabetes, hyperlipidemia, gout, obesity, CKD-3, currently not taking medications except for Aleve.   Clinical Impression   Pt walks short distances with a walker and is assisted for ADL and all IADL at baseline. Pt presenting with impaired cognition and somewhat irritable. Limited assessment today as pt declining OOB once seated at EOB with minimal assistance. Will follow acutely. Family is hopeful that they can manage pt at home if he gets better.     Follow Up Recommendations  SNF;Supervision/Assistance - 24 hour    Equipment Recommendations       Recommendations for Other Services       Precautions / Restrictions Precautions Precautions: Fall Restrictions Weight Bearing Restrictions: No      Mobility Bed Mobility Overal bed mobility: Needs Assistance Bed Mobility: Supine to Sit;Sit to Supine     Supine to sit: Min assist Sit to supine: Min assist   General bed mobility comments: assist for LEs and cues for technique, + rail  Transfers                  General transfer comment: pt declined OOB with lunch arriving for pt, daughter and wife     Balance Overall balance assessment: Needs assistance   Sitting balance-Leahy Scale: Fair                                     ADL either performed or assessed with clinical judgement   ADL Overall ADL's : Needs assistance/impaired Eating/Feeding: Set up;Bed level   Grooming: Wash/dry hands;Bed level;Set up   Upper Body Bathing: Moderate assistance;Sitting   Lower Body Bathing: Total assistance;Bed level   Upper Body Dressing : Minimal assistance;Sitting   Lower Body Dressing: Total assistance;Bed level                       Vision Patient Visual Report: No change from baseline       Perception     Praxis      Pertinent Vitals/Pain Pain Assessment: No/denies pain     Hand Dominance Left   Extremity/Trunk Assessment Upper Extremity Assessment Upper Extremity Assessment: Generalized weakness   Lower Extremity Assessment Lower Extremity Assessment: Defer to PT evaluation       Communication Communication Communication: HOH   Cognition Arousal/Alertness: Awake/alert Behavior During Therapy: WFL for tasks assessed/performed Overall Cognitive Status: Impaired/Different from baseline Area of Impairment: Orientation;Following commands;Safety/judgement;Problem solving;Memory                 Orientation Level: Disoriented to;Time;Situation   Memory: Decreased short-term memory Following Commands: Follows one  step commands with increased time Safety/Judgement: Decreased awareness of safety;Decreased awareness of deficits   Problem Solving: Slow processing;Decreased initiation;Difficulty sequencing;Requires verbal cues;Requires tactile cues     General Comments       Exercises     Shoulder Instructions      Home Living Family/patient expects to be discharged to:: Private residence Living Arrangements:  Spouse/significant other Available Help at Discharge: Family;Friend(s);Available 24 hours/day Type of Home: House                       Home Equipment: Walker - 2 wheels   Additional Comments: wife reports patient was only ambulatory within the home      Prior Functioning/Environment Level of Independence: Needs assistance  Gait / Transfers Assistance Needed: RW - only within the home ADL's / Homemaking Assistance Needed: wife assisting            OT Problem List: Decreased strength;Decreased activity tolerance;Decreased cognition;Decreased knowledge of use of DME or AE      OT Treatment/Interventions: Self-care/ADL training;DME and/or AE instruction;Patient/family education;Balance training;Therapeutic activities;Cognitive remediation/compensation    OT Goals(Current goals can be found in the care plan section) Acute Rehab OT Goals Patient Stated Goal: none stated OT Goal Formulation: With patient/family Time For Goal Achievement: 12/16/17 Potential to Achieve Goals: Good ADL Goals Pt Will Perform Grooming: (P) with min assist;standing Pt Will Perform Upper Body Bathing: (P) with min assist;sitting Pt Will Perform Upper Body Dressing: (P) with supervision;with set-up;sitting Pt Will Transfer to Toilet: (P) with mod assist;ambulating;bedside commode Pt Will Perform Toileting - Clothing Manipulation and hygiene: (P) with mod assist;sit to/from stand Additional ADL Goal #1: (P) Pt will perform bed mobility with supervision in preparation for ADL.  OT Frequency: Min 2X/week   Barriers to D/C:            Co-evaluation              AM-PAC PT "6 Clicks" Daily Activity     Outcome Measure Help from another person eating meals?: A Little Help from another person taking care of personal grooming?: A Little Help from another person toileting, which includes using toliet, bedpan, or urinal?: Total Help from another person bathing (including washing, rinsing,  drying)?: A Lot Help from another person to put on and taking off regular upper body clothing?: A Little Help from another person to put on and taking off regular lower body clothing?: Total 6 Click Score: 13   End of Session    Activity Tolerance: Other (comment)(pt irritable) Patient left: in bed;with call bell/phone within reach;with family/visitor present  OT Visit Diagnosis: Cognitive communication deficit (R41.841);Muscle weakness (generalized) (M62.81)                Time: 1610-9604 OT Time Calculation (min): 22 min Charges:  OT General Charges $OT Visit: 1 Visit OT Evaluation $OT Eval Moderate Complexity: 1 Mod G-Codes:     Evern Bio 12/02/2017, 1:27 PM  12/02/2017 Martie Round, OTR/L Pager: [phone]

## 2017-12-03 DIAGNOSIS — F0390 Unspecified dementia without behavioral disturbance: Secondary | ICD-10-CM | POA: Diagnosis present

## 2017-12-03 DIAGNOSIS — I5031 Acute diastolic (congestive) heart failure: Secondary | ICD-10-CM

## 2017-12-03 DIAGNOSIS — F0391 Unspecified dementia with behavioral disturbance: Secondary | ICD-10-CM

## 2017-12-03 LAB — CBC
HCT: 38.1 % — ABNORMAL LOW (ref 39.0–52.0)
Hemoglobin: 12.1 g/dL — ABNORMAL LOW (ref 13.0–17.0)
MCH: 31.3 pg (ref 26.0–34.0)
MCHC: 31.8 g/dL (ref 30.0–36.0)
MCV: 98.7 fL (ref 78.0–100.0)
PLATELETS: 252 10*3/uL (ref 150–400)
RBC: 3.86 MIL/uL — ABNORMAL LOW (ref 4.22–5.81)
RDW: 14.2 % (ref 11.5–15.5)
WBC: 7.9 10*3/uL (ref 4.0–10.5)

## 2017-12-03 LAB — BASIC METABOLIC PANEL
Anion gap: 8 (ref 5–15)
BUN: 24 mg/dL — AB (ref 6–20)
CALCIUM: 8.7 mg/dL — AB (ref 8.9–10.3)
CO2: 29 mmol/L (ref 22–32)
CREATININE: 1.44 mg/dL — AB (ref 0.61–1.24)
Chloride: 102 mmol/L (ref 101–111)
GFR calc non Af Amer: 42 mL/min — ABNORMAL LOW (ref >60)
GFR, EST AFRICAN AMERICAN: 48 mL/min — AB (ref >60)
Glucose, Bld: 118 mg/dL — ABNORMAL HIGH (ref 65–99)
Potassium: 4 mmol/L (ref 3.5–5.1)
SODIUM: 139 mmol/L (ref 135–145)

## 2017-12-03 LAB — GLUCOSE, CAPILLARY: GLUCOSE-CAPILLARY: 86 mg/dL (ref 65–99)

## 2017-12-03 MED ORDER — RISPERIDONE 0.25 MG PO TABS: 0.2500 mg | ORAL_TABLET | Freq: Every evening | ORAL | 0 refills | Status: AC | PRN

## 2017-12-03 MED ORDER — DILTIAZEM HCL 60 MG PO TABS
60.0000 mg | ORAL_TABLET | Freq: Three times a day (TID) | ORAL | 0 refills | Status: AC
Start: 2017-12-03 — End: ?

## 2017-12-03 MED ORDER — FUROSEMIDE 20 MG PO TABS
20.0000 mg | ORAL_TABLET | Freq: Every day | ORAL | Status: DC
  Administered 2017-12-03: 20 mg via ORAL
  Filled 2017-12-03: qty 1

## 2017-12-03 MED ORDER — FUROSEMIDE 20 MG PO TABS: 20.0000 mg | ORAL_TABLET | Freq: Every day | ORAL | 0 refills | Status: AC

## 2017-12-03 MED ORDER — ATORVASTATIN CALCIUM 80 MG PO TABS: 80.0000 mg | ORAL_TABLET | Freq: Every day | ORAL | 0 refills | Status: AC

## 2017-12-03 MED ORDER — APIXABAN 5 MG PO TABS: 5.0000 mg | ORAL_TABLET | Freq: Two times a day (BID) | ORAL | 0 refills | Status: AC

## 2017-12-03 NOTE — Progress Notes (Addendum)
Patient showed some disorientation, confusion, and impulsiveness as he came out the room wanting to go to the bathroom.  Patient was helped by 2 NT and a nurse back to the room.  I was able to give him some Ambien for sleep. I will keep monitoring patient.

## 2017-12-03 NOTE — Progress Notes (Signed)
Progress Note  Patient Name: Albert [name] Date of Encounter: 12/03/2017  Primary Cardiologist: Chrystie NoseKenneth C Hilty, MD   Subjective   No complaints.   Inpatient Medications    Scheduled Meds: . apixaban  5 mg Oral BID  . aspirin EC  325 mg Oral Daily  . atorvastatin  80 mg Oral q1800  . diltiazem  60 mg Oral Q8H   Continuous Infusions:  PRN Meds: acetaminophen, hydrALAZINE, morphine injection, nitroGLYCERIN, ondansetron (ZOFRAN) IV, zolpidem   Vital Signs    Vitals:   12/03/17 0022 12/03/17 0628 12/03/17 0740 12/03/17 0912  BP: 119/80 (!) 121/91 (!) 70/58 110/76  Pulse: 97 93 71   Resp: 16 18    Temp: 97.6 F (36.4 C) 97.6 F (36.4 C) (!) 97.5 F (36.4 C)   TempSrc: Oral Oral Oral   SpO2: 97%  94%   Weight:  219 lb (99.3 kg)    Height:  5\' 11"  (1.803 m)      Intake/Output Summary (Last 24 hours) at 12/03/2017 1123 Last data filed at 12/03/2017 0900 Gross per 24 hour  Intake 820 ml  Output 1650 ml  Net -830 ml   Filed Weights   11/30/17 1735 12/01/17 1330 12/03/17 0628  Weight: 222 lb 5 oz (100.8 kg) 219 lb 12.8 oz (99.7 kg) 219 lb (99.3 kg)    Telemetry    Rate controlled afib, pauses up to 1.8 sec- Personally Reviewed  ECG    na  Physical Exam   GEN: No acute distress.   Neck: No JVD Cardiac:irreg, 3/6 systolic murmur rusb, rubs, or gallops.  Respiratory: Clear to auscultation bilaterally. GI: Soft, nontender, non-distended  MS: No edema; No deformity. Neuro:  Nonfocal  Psych: Normal affect   Labs    Chemistry Recent Labs  Lab 11/30/17 1745  12/01/17 1020 12/02/17 0418 12/03/17 0917  NA 138   < > 139 137 139  K 4.9   < > 4.7 4.5 4.0  CL 103   < > 106 105 102  CO2 26  --  24 27 29   GLUCOSE 112*   < > 85 93 118*  BUN 29*   < > 25* 27* 24*  CREATININE 1.62*   < > 1.40* 1.34* 1.44*  CALCIUM 9.4  --  8.8* 8.6* 8.7*  PROT 6.7  --   --   --   --   ALBUMIN 3.6  --   --   --   --   AST 26  --   --   --   --   ALT 22  --   --   --   --    ALKPHOS 60  --   --   --   --   BILITOT 0.9  --   --   --   --   GFRNONAA 36*  --  43* 46* 42*  GFRAA 42*  --  50* 53* 48*  ANIONGAP 9  --  9 5 8    < > = values in this interval not displayed.     Hematology Recent Labs  Lab 12/01/17 1020 12/02/17 0418 12/03/17 0917  WBC 7.5 8.1 7.9  RBC 3.86* 3.74* 3.86*  HGB 12.2* 11.8* 12.1*  HCT 39.1 37.4* 38.1*  MCV 101.3* 100.0 98.7  MCH 31.6 31.6 31.3  MCHC 31.2 31.6 31.8  RDW 14.3 14.2 14.2  PLT 216 235 252    Cardiac Enzymes Recent Labs  Lab 11/30/17 2335 12/01/17 0356 12/01/17  1020  TROPONINI 0.14* 0.13* 0.11*    Recent Labs  Lab 11/30/17 1749  TROPIPOC 0.15*     BNP Recent Labs  Lab 11/30/17 2335  BNP 632.6*     DDimer No results for input(s): DDIMER in the last 168 hours.   Radiology    No results found.  Cardiac Studies     Patient Profile     Albert [name] is seen today in the Emergency department for a new diagnosis of atrial fibrillation. The patient is 84 years old and has a past history of mild dementia, gout, hyperlipidemia, and a pulmonary nodule. Admitted with AMS. Cardiology consulted for detection of new onset afib.     Assessment & Plan    1. Afib - new diagnosis this admission. Initially on IV dilt, converted to oral dilt 60mg  tid - started on eliquis 5mg  bid, would stop ASA 325 - some pauses on telemetry up to 1.8 secs, not significant enough to alter therapy at this time. - would continue short acting dilt in case pause become a bigger issue.  - stop ASA on eliquis.   2. AMS - per primary team. From note unclear etiology, suspect progression of his dementia  3. Elevated troponin - mild in the setting of afib with RVR. Peak 0.14 and trended down with rate control. EKG anteroseptal Qwaves, nonspecific inferior/lateral ST/T changes - 11/2017 echo LVEF 55-60%, no WMAs. Cannot describe diastolic function due to afib. Severe LAE would suggest diastolic dysfunction - would not plan for  ishcemic testing at this time with very likely etiology his afib with RVR and CHF, in genral not a great candidate for ischemic testing as well given his dementia.   3. Aortic stenosis - moderate by 11/2017 echo. AVA VTI 1.06, mean grad  4. Pulmonary HTN - PASP 62 by echo. Normal RV function. Echo supports some probable diastolic dysfunction.  -   5. Acute diastolic HF - CXR with CHF, BNP 600. Echo normal LVEF, cannot report diastolic function due to afib but echo suggestive. - negative 1 L yesterday. Mild flucutations in Cr.  - mild ongoing fluid overload, would start lasix 20mg  daily oral   From cardiac standpoint would be reasonable for discharge, we will arrange f/u in 2-3 weeks. If stays we will f/u telemetry tomorrow.    For questions or updates, please contact [facility] Please consult [email or web address] for contact info under Cardiology/STEMI.      Joanie Coddington, MD  12/03/2017, 11:23 AM

## 2017-12-03 NOTE — Progress Notes (Signed)
Patient has been having pauses all night, nothing over 2 seconds.  Gave report to upcoming day nurse Yoko.

## 2017-12-03 NOTE — Discharge Summary (Signed)
Physician Discharge Summary  Albert [name] ZOX:[id number] DOB: 1927/12/19 DOA: 11/30/2017  PCP: Martha Clan, MD  Admit date: 11/30/2017 Discharge date: 12/03/2017  Time spent: 55 minutes  Recommendations for Outpatient Follow-up:  1. Follow-up with Martha Clan, MD in 1 to 2 weeks.  On follow-up patient will need a comprehensive metabolic profile done to follow-up on electrolytes, renal function, LFTs.  Patient was started on a statin during this hospitalization as well as diuretics for acute diastolic CHF.  Patient also noted to have a probable dementia which will need to be followed up upon per PCP. 2. Follow-up with Dr. Rennis Golden, cardiology in 2 to 3 weeks.   Discharge Diagnoses:  Principal Problem:   Atrial fibrillation (HCC) Active Problems:   Acute metabolic encephalopathy   Dementia: Probable   Elevated troponin   Fall   Type II diabetes mellitus with renal manifestations (HCC)   HLD (hyperlipidemia)   CKD (chronic kidney disease), stage III (HCC)   Chest pain   Discharge Condition: Stable and improved.  Diet recommendation: Heart healthy  Filed Weights   11/30/17 1735 12/01/17 1330 12/03/17 0628  Weight: 100.8 kg (222 lb 5 oz) 99.7 kg (219 lb 12.8 oz) 99.3 kg (219 lb)    History of present illness:  Per Dr Lonny Prude is a 84 y.o. male with medical history significant of diet-controlled diabetes, hyperlipidemia, gout, obesity, CKD-3, currently not taking medications except for Aleve, who presented with confusion, poor balance, chest pain, palpitation and fall.  Per patient's son and wife, patient had been confused in the past 2 weeks.  Patient had broken tooth removed 2 weeks ago, since then he has been mildly confused.  She also has poor balance. He fell this AM, injured his right side of head on the door.  No LOC.  Patient did not have unilateral weakness or numbness in extremities.  No facial droop.  He seemed to have mild slurred speech per his son.  He has  chronic hearing loss, wearing hearing aid.  No vision loss.  Patient also has been having intermittent mild chest pain for about 1 week.  It was located in substernal area, dull, nonradiating.  It was exertional, happened when he moves around intermittently.  Currently patient did not have chest pain or shortness of breath, cough.  No fever or chills.  Patient did not have nausea, vomiting, diarrhea, abdominal pain, symptoms of UTI.  Patient was found to have new onset A. fib with RVR, with heart rate up to 130s, Cardizem drip is started in ED.  ED Course: pt was found to have positive troponin 0.15, WBC 9.0, INR 1.15, PTT 33, renal function close to baseline, temperature 97.2, tachycardia, no tachypnea, oxygen saturation 95% on room air.  Patient is admitted to stepdown as inpatient.  Cardiology will be consulted by EDP.  # CT of head showed: 1. Right parietal scalp contusion without underlying skull fracture. 2. Moderate superficial and central atrophy with small vessel ischemia. Small left caudate head lacunar infarct. 3. Dense atherosclerotic calcifications at the skull base as above. 4. No acute intracranial abnormality identified on CT.     Hospital Course:  1 acute metabolic encephalopathy likely secondary to underlying dementia/fall Likely secondary to underlying dementia.  Patient on admission was noted to have some mild slurred speech poor balance however no other focal neurological findings.  CT head negative for any acute intracranial abnormalities however did show some small lacunar infarcts.  MRI brain was negative for any  acute abnormalities, old right frontal lobe infarct, and generalized brain atrophy without acute abnormality.  MRI consistent with dementia. Chest x-ray unremarkable.  No signs of any acute infection.  TSH within normal limits at 3.931.  RPR Nonreactive.  Vitamin B12 levels at 595.  Ammonia level was 34.  Improving clinically per family.  PT/OT.  Patient will be  discharged home in stable and improved condition per family.  Patient will be discharged on Risperdal 0.25 mg nightly as needed agitation.  Outpatient follow-up with PCP.  2.  New onset atrial fibrillation CHA2DS2VASC AT LEAST 4 Patient noted to be new onset A. fib on admission with heart rates in the 130s and started on a Cardizem drip in the ED.  Cardiac enzymes mildly elevated going from 0.15 to 0.14-0.13 to 0.11.  BNP was 632.6.  TSH within normal limits.  2D echo with moderate left ear, severe left atrial enlargement, moderate pulmonary hypertension, normal systolic function with no wall motion abnormalities.  Cardizem drip was transitioned to oral Cardizem on admission and dose uptitrated to 60 mg every 8 hours for better rate control.  Patient on IV heparin for anticoagulation and has been transitioned per cardiology to Eliquis.  Patient's heart rate improved during the hospitalization.  Patient was noted to have some pauses on telemetry up to 1.8 seconds however per cardiology not significant  enough to discontinue rate limiting medications.  It was recommended per cardiology to continue patient on short acting Cardizem in case pause becomes a bigger issue.  Aspirin was discontinued.  Patient was transitioned from IV heparin to oral Eliquis for anticoagulation.  Outpatient follow-up with cardiology.    3.  Chest pain and elevated troponin Troponin seemed to be slowly trending down.  EKG with no significant ischemic changes.  Fasting lipid panel with a LDL of 75.  A1c 5.2.  2D echo with EF of 55 to 60% with no wall motion abnormalities.  Left atrium severely dilated, moderate tricuspid valvular regurgitation, severely calcified mitral valvular, moderate aortic valvular stenosis.  Patient remained chest pain-free throughout the hospitalization.  Patient was seen in consultation by cardiology who feel patient is unlikely to be a SAVR or TAVR candidate in the future with dementia.  Cardiology recommended  40 mg IV Lasix x1 output of 1.950 L.  Patient be discharged home on oral Lasix.  Patient was also maintained on oral Cardizem during the hospitalization for better rate control.  It was felt no further ischemic work-up needed at this time.  Patient was followed by cardiology and felt to be stable condition with outpatient follow-up.  4 acute diastolic heart failure Chest x-ray obtained on admission was concerning for pulmonary edema.  BNP noted to be 600.  2D echo done had a normal EF however could not determine diastolic function due to A. fib.  Patient was given a dose of Lasix 40 mg IV x1 with a urine output of 1.950 L over the past 24 hours.  Patient was seen in consultation by cardiology and it was felt on day of discharge that patient had mild ongoing fluid overload and patient started on Lasix 20 mg orally daily.  Patient will follow-up with PCP and cardiology in the outpatient setting.  5.  Moderate aortic stenosis Per 2D echo of 11/2017.  Patient deemed not a surgical candidate per cardiology.  Outpatient follow-up with cardiology.  6.  Pulmonary hypertension PASP of 62 per 2D echo.  Normal right ventricular function.  Outpatient follow-up with cardiology.  7.  Chronic kidney disease stage III (baseline creatinine 1.5-1.7) Stable.  8.  Diabetes mellitus type 2 Hemoglobin A1c was 5.2.  Per family patient has been taken off oral hypoglycemic agents.    BG's were stable throughout the hospitalization.  Outpatient follow-up.   9.  Hyperlipidemia LDL at 75.    Patient was placed on a statin which she will be discharged home on.  Outpatient follow-up with PCP.      Procedures:  2D echo 12/01/2017  X-ray 12/01/2017  ChestT head 11/30/2017  MRI brain 11/30/2017      Consultations:  Cardiology: Dr. Deforest Hoyles 12/01/2017      Discharge Exam: Vitals:   12/03/17 1427 12/03/17 1429  BP: 104/75 104/75  Pulse:    Resp:    Temp:    SpO2:      General: NAD Cardiovascular:  Irregularly irregular. Respiratory: CTAB.  Discharge Instructions    Allergies as of 12/03/2017   No Known Allergies     Medication List    TAKE these medications   apixaban 5 MG Tabs tablet Commonly known as:  ELIQUIS Take 1 tablet (5 mg total) by mouth 2 (two) times daily.   atorvastatin 80 MG tablet Commonly known as:  LIPITOR Take 1 tablet (80 mg total) by mouth daily at 6 PM.   diltiazem 60 MG tablet Commonly known as:  CARDIZEM Take 1 tablet (60 mg total) by mouth every 8 (eight) hours.   furosemide 20 MG tablet Commonly known as:  LASIX Take 1 tablet (20 mg total) by mouth daily. Start taking on:  12/04/2017   risperiDONE 0.25 MG tablet Commonly known as:  RISPERDAL Take 1 tablet (0.25 mg total) by mouth at bedtime as needed (agitation).      No Known Allergies Follow-up Information    Hilty, Lisette Abu, MD Follow up.   Specialty:  Cardiology Why:  the office should call you Monday or Tuesday for follow up with Dr. Rennis Golden, cardiology in 2-3 weeks.  if you have not heard by Tuesday please call them.   Contact information: [address] [place] [address] [place] [address] [phone]        Martha Clan, MD. Schedule an appointment as soon as possible for a visit in 1 week(s).   Specialty:  Internal Medicine Why:  f/u in 1-2 weeks. Contact information: [address] [place] [address] [phone]            The results of significant diagnostics from this hospitalization (including imaging, microbiology, ancillary and laboratory) are listed below for reference.    Significant Diagnostic Studies: Ct Head Wo Contrast  Result Date: 11/30/2017 CLINICAL DATA:  Slurred speech and increased confusion with loss of balance for 2-3 weeks. EXAM: CT HEAD WITHOUT CONTRAST TECHNIQUE: Contiguous axial images were obtained from the base of the skull through the vertex without intravenous contrast. COMPARISON:  None. FINDINGS: BRAIN: There is moderate  sulcal and ventricular prominence consistent with superficial and central atrophy. Left caudate head lacunar infarct is noted, age indeterminate but likely chronic. No intraparenchymal hemorrhage, mass effect nor midline shift. Mild-to-moderate periventricular and subcortical white matter hypodensities consistent with chronic small vessel ischemic disease are identified. No acute large vascular territory infarcts. No abnormal extra-axial fluid collections. Basal cisterns are not effaced and midline. VASCULAR: Moderate calcific atherosclerosis of the carotid siphons and both middle cerebral arteries. SKULL: No skull fracture. No significant scalp soft tissue swelling. SINUSES/ORBITS: The mastoid air-cells are clear. The included paranasal sinuses are well-aerated.The included  ocular globes and orbital contents are non-suspicious. OTHER: Right posterior parietal scalp contusion is noted. IMPRESSION: 1. Right parietal scalp contusion without underlying skull fracture. 2. Moderate superficial and central atrophy with small vessel ischemia. Small left caudate head lacunar infarct. 3. Dense atherosclerotic calcifications at the skull base as above. 4. No acute intracranial abnormality identified on CT. Electronically Signed   By: Tollie Eth M.D.   On: 11/30/2017 18:26   Mr Brain Wo Contrast (neuro Protocol)  Result Date: 11/30/2017 CLINICAL DATA:  Slurred speech and altered mental status EXAM: MRI HEAD WITHOUT CONTRAST TECHNIQUE: Multiplanar, multiecho pulse sequences of the brain and surrounding structures were obtained without intravenous contrast. COMPARISON:  Head CT 11/30/2017 FINDINGS: The study is degraded by motion, despite efforts to reduce this artifact, including utilization of motion-resistant MR sequences. The findings of the study are interpreted in the context of reduced sensitivity/specificity. BRAIN: There is no acute infarct, acute hemorrhage or mass effect. The midline structures are normal. There  is an old right frontal lobe infarct. Minimal white matter hyperintensity, nonspecific and commonly seen in asymptomatic patients of this age. Generalized atrophy without lobar predilection. Susceptibility-sensitive sequences show no chronic microhemorrhage or superficial siderosis. VASCULAR: Major intracranial arterial and venous sinus flow voids are preserved. SKULL AND UPPER CERVICAL SPINE: The visualized skull base, calvarium, upper cervical spine and extracranial soft tissues are normal. SINUSES/ORBITS: No fluid levels or advanced mucosal thickening. No mastoid or middle ear effusion. The orbits are normal. IMPRESSION: Old right frontal lobe infarct and generalized brain atrophy without acute abnormality. Electronically Signed   By: Deatra Robinson M.D.   On: 11/30/2017 22:48   Dg Chest Port 1 View  Result Date: 12/01/2017 CLINICAL DATA:  Increasing fatigue over 2 weeks EXAM: PORTABLE CHEST 1 VIEW COMPARISON:  02/26/2009 FINDINGS: Cardiac shadow is mildly enlarged but stable. Aortic calcifications are seen. Vascular congestion and mild edema is identified consistent with CHF. No sizable effusion is noted. No bony abnormality is seen. IMPRESSION: Changes consistent with CHF. Electronically Signed   By: Alcide Clever M.D.   On: 12/01/2017 09:53    Microbiology: Recent Results (from the past 240 hour(s))  Urine Culture     Status: None   Collection Time: 12/01/17  5:27 AM  Result Value Ref Range Status   Specimen Description URINE, CLEAN CATCH  Final   Special Requests NONE  Final   Culture   Final    NO GROWTH Performed at [facility], [address] [place], [place] [address]    Report Status 12/02/2017 FINAL  Final  MRSA PCR Screening     Status: None   Collection Time: 12/01/17  1:18 PM  Result Value Ref Range Status   MRSA by PCR NEGATIVE NEGATIVE Final    Comment:        The GeneXpert MRSA Assay (FDA approved for NASAL specimens only), is one component of a comprehensive MRSA  colonization surveillance program. It is not intended to diagnose MRSA infection nor to guide or monitor treatment for MRSA infections. Performed at [facility], [address] [place], [place] [address]      Labs: Basic Metabolic Panel: Recent Labs  Lab 11/30/17 1745 11/30/17 1751 12/01/17 1020 12/02/17 0418 12/03/17 0917  NA 138 137 139 137 139  K 4.9 4.8 4.7 4.5 4.0  CL 103 101 106 105 102  CO2 26  --  24 27 29   GLUCOSE 112* 108* 85 93 118*  BUN 29* 31* 25*  27* 24*  CREATININE 1.62* 1.50* 1.40* 1.34* 1.44*  CALCIUM 9.4  --  8.8* 8.6* 8.7*  MG  --   --  2.0 2.0  --    Liver Function Tests: Recent Labs  Lab 11/30/17 1745  AST 26  ALT 22  ALKPHOS 60  BILITOT 0.9  PROT 6.7  ALBUMIN 3.6   No results for input(s): LIPASE, AMYLASE in the last 168 hours. Recent Labs  Lab 11/30/17 2335  AMMONIA 34   CBC: Recent Labs  Lab 11/30/17 1745 11/30/17 1751 12/01/17 1020 12/02/17 0418 12/03/17 0917  WBC 9.0  --  7.5 8.1 7.9  NEUTROABS 6.7  --  5.2  --   --   HGB 12.9* 13.6 12.2* 11.8* 12.1*  HCT 40.6 40.0 39.1 37.4* 38.1*  MCV 99.3  --  101.3* 100.0 98.7  PLT 251  --  216 235 252   Cardiac Enzymes: Recent Labs  Lab 11/30/17 2335 12/01/17 0356 12/01/17 1020  TROPONINI 0.14* 0.13* 0.11*   BNP: BNP (last 3 results) Recent Labs    11/30/17 2335  BNP 632.6*    ProBNP (last 3 results) No results for input(s): PROBNP in the last 8760 hours.  CBG: Recent Labs  Lab 12/01/17 0810 12/01/17 2118 12/02/17 0024 12/02/17 0340 12/03/17 0737  GLUCAP 79 138* 86 89 86       Signed:  Ramiro Harvestaniel Morris Longenecker MD.  [facility] 12/03/2017, 4:05 PM

## 2017-12-03 NOTE — Progress Notes (Signed)
Discharge instruction was given to pt and family. Belongings were sent home with family. Printed prescriptions were given to his wife.  Hinton DyerYoko Adylene Dlugosz, RN

## 2017-12-08 DIAGNOSIS — I5031 Acute diastolic (congestive) heart failure: Secondary | ICD-10-CM | POA: Diagnosis not present

## 2017-12-08 DIAGNOSIS — R531 Weakness: Secondary | ICD-10-CM | POA: Diagnosis not present

## 2017-12-08 DIAGNOSIS — R0609 Other forms of dyspnea: Secondary | ICD-10-CM | POA: Diagnosis not present

## 2017-12-08 DIAGNOSIS — F0390 Unspecified dementia without behavioral disturbance: Secondary | ICD-10-CM | POA: Diagnosis not present

## 2017-12-08 DIAGNOSIS — R079 Chest pain, unspecified: Secondary | ICD-10-CM | POA: Diagnosis not present

## 2017-12-08 DIAGNOSIS — I272 Pulmonary hypertension, unspecified: Secondary | ICD-10-CM | POA: Diagnosis not present

## 2017-12-08 DIAGNOSIS — I350 Nonrheumatic aortic (valve) stenosis: Secondary | ICD-10-CM | POA: Diagnosis not present

## 2017-12-08 DIAGNOSIS — G9341 Metabolic encephalopathy: Secondary | ICD-10-CM | POA: Diagnosis not present

## 2017-12-08 DIAGNOSIS — I4891 Unspecified atrial fibrillation: Secondary | ICD-10-CM | POA: Diagnosis not present

## 2017-12-12 DIAGNOSIS — I499 Cardiac arrhythmia, unspecified: Secondary | ICD-10-CM | POA: Diagnosis not present

## 2017-12-12 DIAGNOSIS — R402441 Other coma, without documented Glasgow coma scale score, or with partial score reported, in the field [EMT or ambulance]: Secondary | ICD-10-CM | POA: Diagnosis not present

## 2017-12-19 DIAGNOSIS — 419620001 Death: Secondary | SNOMED CT | POA: Diagnosis not present

## 2017-12-19 DEATH — deceased

## 2017-12-26 NOTE — Progress Notes (Deleted)
Cardiology Office Note   Date:  12/26/2017   ID:  Albert ManisJack S Kaleta, DOB 10/22/1927, MRN [id number]  PCP:  Martha ClanShaw, William, MD  Cardiologist:  Dr. Rennis GoldenHilty  No chief complaint on file.    History of Present Illness: Albert [name] is a 84 y.o. male who presents for ongoing assessment and management of atrial fibrillation, noted pauses of 1.8 seconds on recent hospitalization, with other history to include hyperlipidemia, mild dementia. He was seen for new onset atrial fib. He was started on Eliquis 5 mg BID. Diltiazem 60 mg TID. Echo revealed aortic valve stenosis, pulmonary hypertension. He was also diagnosed with diastolic CHF. He will need follow up CMET this office visit.     Past Medical History:  Diagnosis Date  . Arthritis    "pain in my joints" (12/01/2017)  . Atrial fibrillation (HCC)   . Dermatitis   . Gout X1  . History of kidney stones   . Hyperlipemia   . Obesity   . Peripheral neuropathy   . Pulmonary nodule   . Type 2 diabetes, diet controlled (HCC)     Past Surgical History:  Procedure Laterality Date  . INCISION AND DRAINAGE Left    "MRSA; between thumb and 1st finger"     Current Outpatient Medications  Medication Sig Dispense Refill  . apixaban (ELIQUIS) 5 MG TABS tablet Take 1 tablet (5 mg total) by mouth 2 (two) times daily. 60 tablet 0  . atorvastatin (LIPITOR) 80 MG tablet Take 1 tablet (80 mg total) by mouth daily at 6 PM. 30 tablet 0  . diltiazem (CARDIZEM) 60 MG tablet Take 1 tablet (60 mg total) by mouth every 8 (eight) hours. 90 tablet 0  . furosemide (LASIX) 20 MG tablet Take 1 tablet (20 mg total) by mouth daily. 30 tablet 0  . risperiDONE (RISPERDAL) 0.25 MG tablet Take 1 tablet (0.25 mg total) by mouth at bedtime as needed (agitation). 30 tablet 0   No current facility-administered medications for this visit.     Allergies:   Patient has no known allergies.    Social History:  The patient  reports that he quit smoking about 20 years ago. His  smoking use included cigarettes. He has a 30.00 pack-year smoking history. He has never used smokeless tobacco. He reports that he drinks about 0.6 oz of alcohol per week. He reports that he does not use drugs.   Family History:  The patient's family history is not on file.    ROS: All other systems are reviewed and negative. Unless otherwise mentioned in H&P    PHYSICAL EXAM: VS:  There were no vitals taken for this visit. , BMI There is no height or weight on file to calculate BMI. GEN: Well nourished, well developed, in no acute distress  HEENT: normal  Neck: no JVD, carotid bruits, or masses Cardiac: ***RRR; no murmurs, rubs, or gallops,no edema  Respiratory:  clear to auscultation bilaterally, normal work of breathing GI: soft, nontender, nondistended, + BS MS: no deformity or atrophy  Skin: warm and dry, no rash Neuro:  Strength and sensation are intact Psych: euthymic mood, full affect   EKG:  EKG {ACTION; IS/IS LKG:[id number]}OT:[id number]} ordered today. The ekg ordered today demonstrates ***   Recent Labs: 11/30/2017: ALT 22; B Natriuretic Peptide 632.6 12/01/2017: TSH 3.931 12/02/2017: Magnesium 2.0 12/03/2017: BUN 24; Creatinine, Ser 1.44; Hemoglobin 12.1; Platelets 252; Potassium 4.0; Sodium 139    Lipid Panel    Component Value Date/Time   CHOL  127 12/01/2017 0356   TRIG 32 12/01/2017 0356   HDL 46 12/01/2017 0356   CHOLHDL 2.8 12/01/2017 0356   VLDL 6 12/01/2017 0356   LDLCALC 75 12/01/2017 0356      Wt Readings from Last 3 Encounters:  12/03/17 219 lb (99.3 kg)  10/18/11 222 lb 12.8 oz (101.1 kg)  04/20/11 229 lb (103.9 kg)      Other studies Reviewed: Additional studies/ records that were reviewed today include: ***. Review of the above records demonstrates: ***   ASSESSMENT AND PLAN:  1.  ***   Current medicines are reviewed at length with the patient today.    Labs/ tests ordered today include: *** Bettey Mare. Liborio Nixon, ANP, AACC   12/26/2017 3:49 PM     [facility] [address] [place], [place] [address] Phone: [phone]; Fax: [phone]

## 2017-12-28 ENCOUNTER — Ambulatory Visit: Payer: Medicare HMO | Admitting: Adult Health

## 2020-05-10 IMAGING — DX DG CHEST 1V PORT
1 series · 2 of 2 positions shown · non-contrast
Comparison: 02/26/2009

CLINICAL DATA: Increasing fatigue over 2 weeks

EXAM:
PORTABLE CHEST 1 VIEW

[Series 1: chest · 0.14mm/px · 2 of 2 slices shown]
[im 1/2]
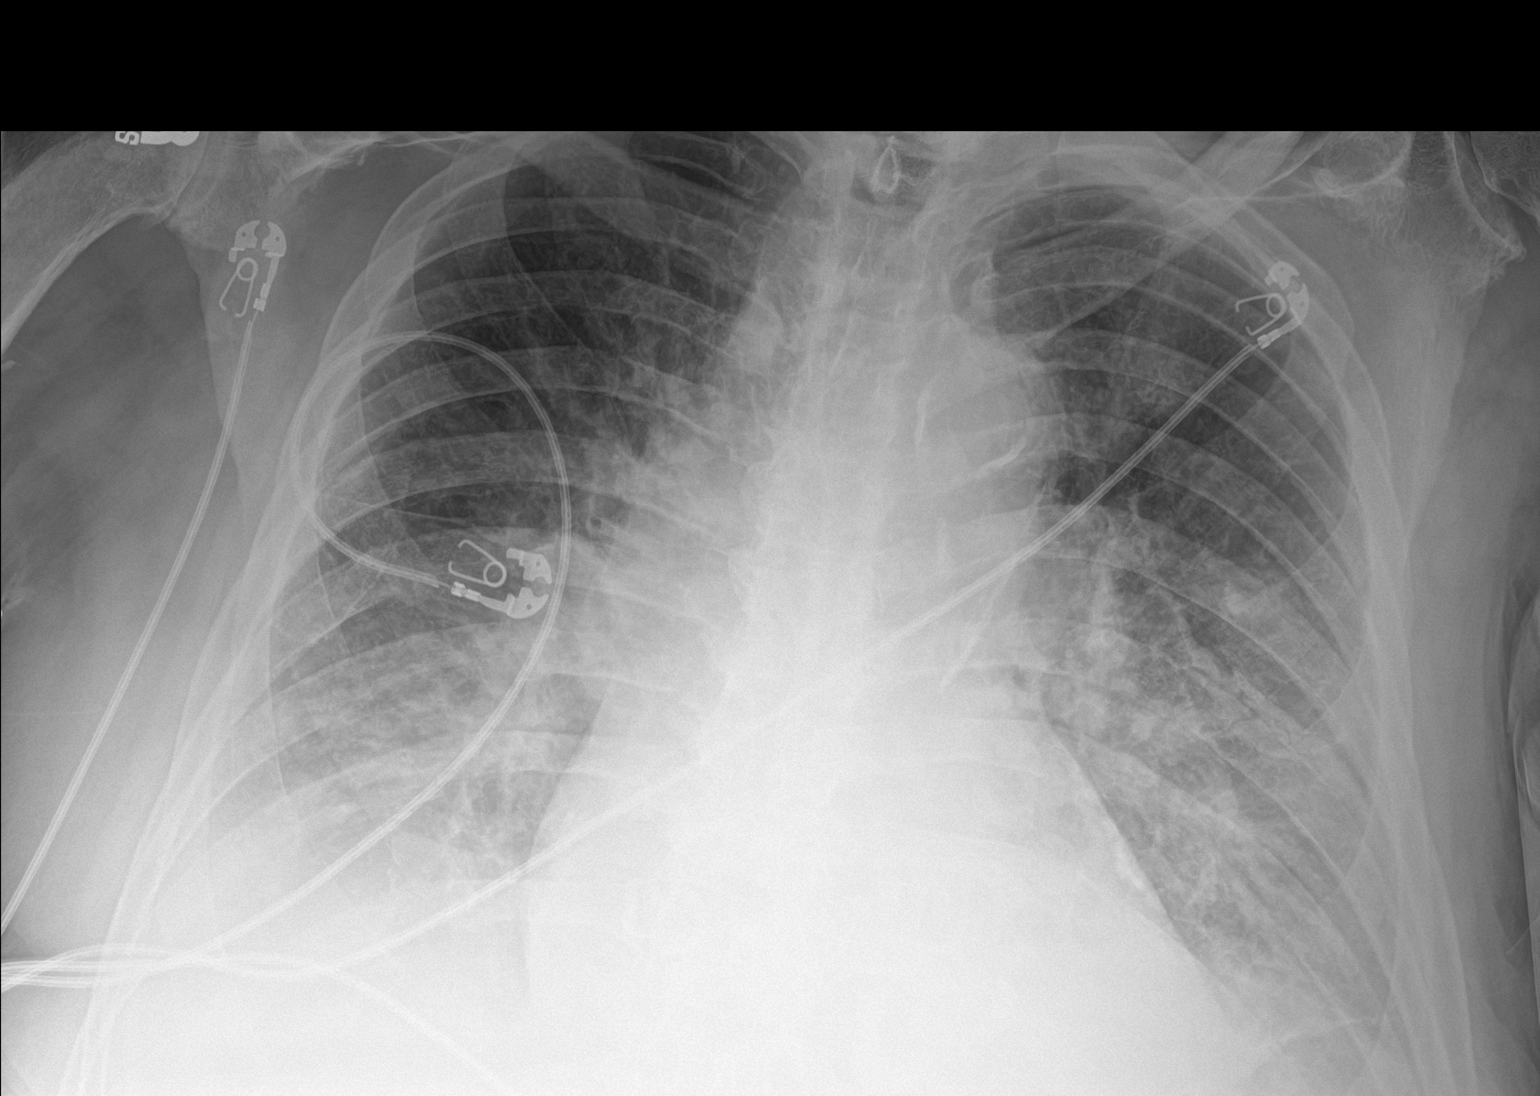
[im 2/2]
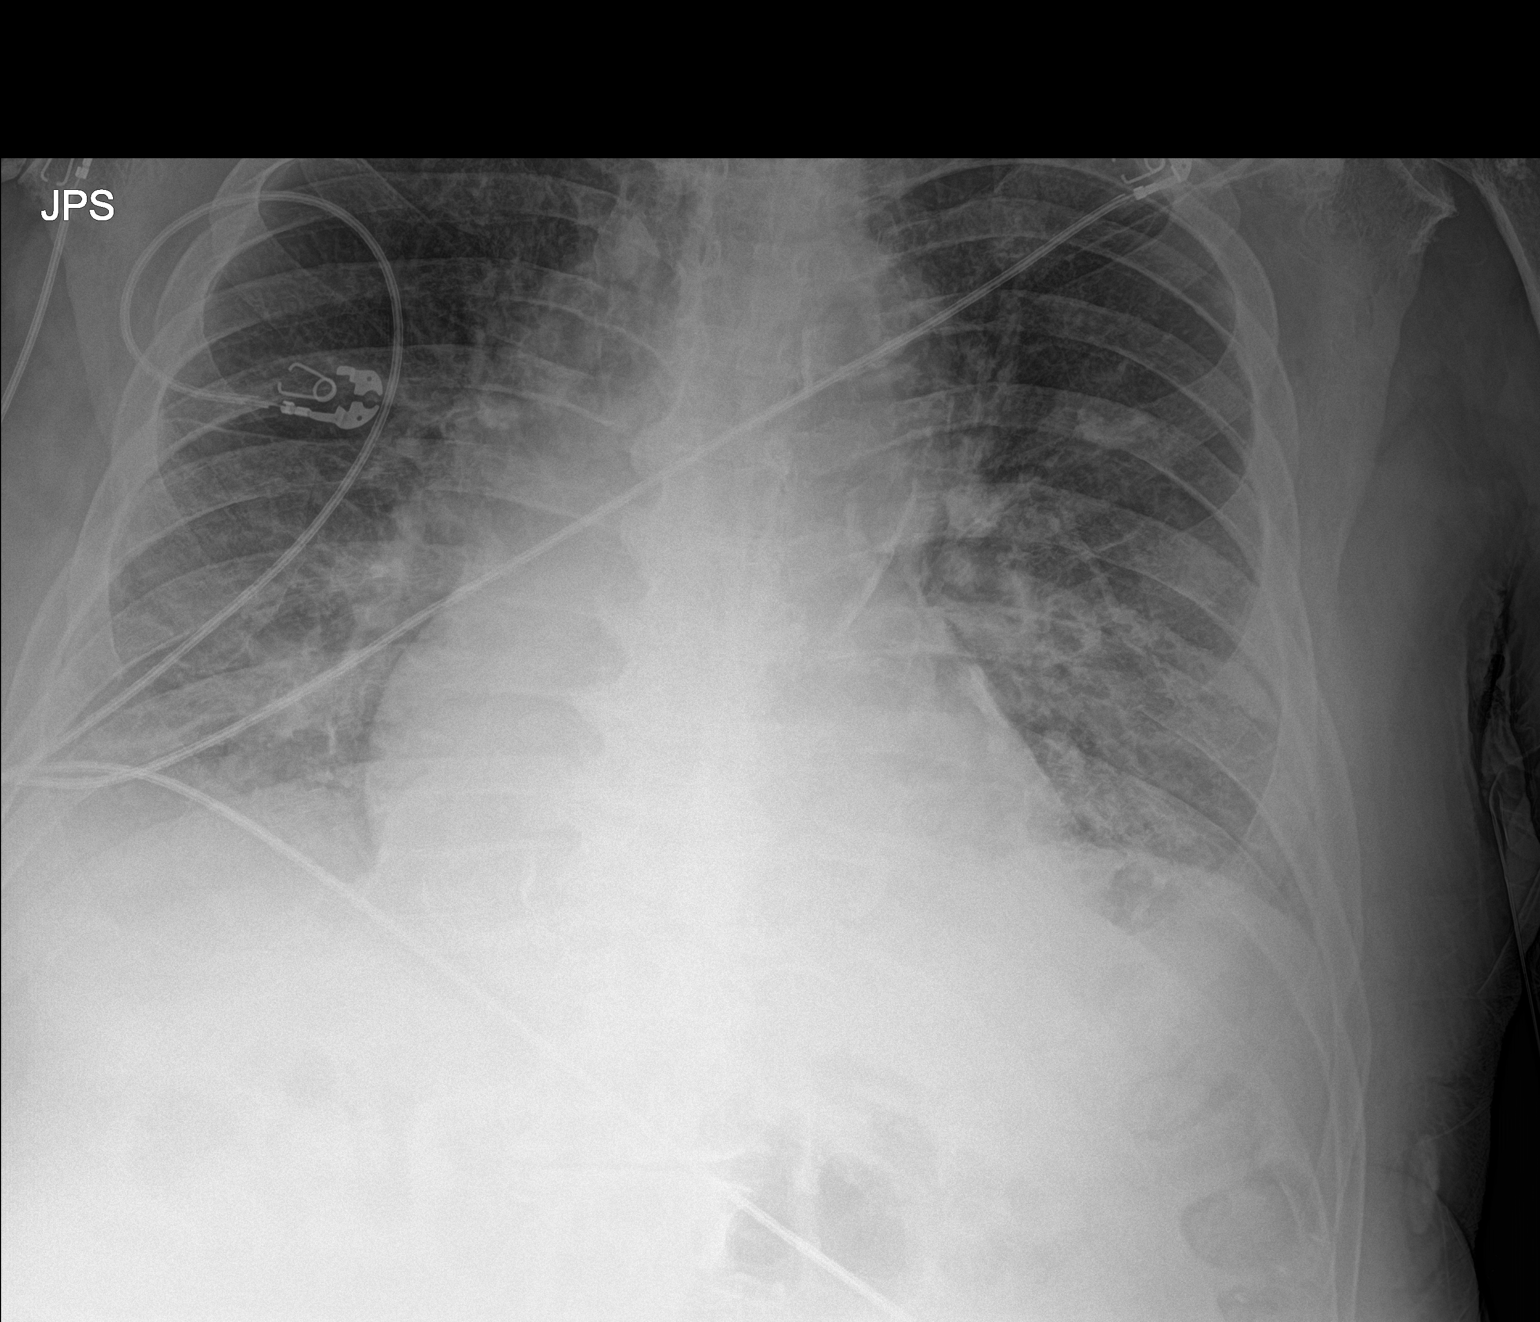

[2 of 2 positions shown; findings below may reference images not displayed]

FINDINGS: Cardiac shadow is mildly enlarged but stable. Aortic calcifications
are seen. Vascular congestion and mild edema is identified
consistent with CHF. No sizable effusion is noted. No bony
abnormality is seen.
IMPRESSION: Changes consistent with CHF.
